# Patient Record
Sex: Male | Born: 1970 | Race: White | Hispanic: No | Marital: Married | Smoking: Former smoker
Health system: Southern US, Community
[De-identification: ages and names within clinical notes are randomized; demographics above are authoritative.]

## PROBLEM LIST (undated history)

## (undated) DIAGNOSIS — I2510 Atherosclerotic heart disease of native coronary artery without angina pectoris: Secondary | ICD-10-CM

## (undated) DIAGNOSIS — E119 Type 2 diabetes mellitus without complications: Secondary | ICD-10-CM

## (undated) DIAGNOSIS — I219 Acute myocardial infarction, unspecified: Secondary | ICD-10-CM

## (undated) HISTORY — PX: SKIN GRAFT SPLIT THICKNESS LEG / FOOT: SUR1303

## (undated) HISTORY — PX: APPENDECTOMY: SHX54

---

## 2013-05-22 ENCOUNTER — Emergency Department (INDEPENDENT_AMBULATORY_CARE_PROVIDER_SITE_OTHER): Payer: Self-pay

## 2013-05-22 ENCOUNTER — Emergency Department (INDEPENDENT_AMBULATORY_CARE_PROVIDER_SITE_OTHER)
Admission: EM | Admit: 2013-05-22 | Discharge: 2013-05-22 | Disposition: A | Discharge status: Home / Self Care | Payer: Self-pay | Source: Home / Self Care | Attending: Emergency Medicine | Admitting: Emergency Medicine

## 2013-05-22 ENCOUNTER — Encounter (HOSPITAL_COMMUNITY): Payer: Self-pay | Admitting: Emergency Medicine

## 2013-05-22 DIAGNOSIS — M79674 Pain in right toe(s): Secondary | ICD-10-CM

## 2013-05-22 DIAGNOSIS — M79609 Pain in unspecified limb: Secondary | ICD-10-CM

## 2013-05-22 MED ORDER — NAPROXEN 500 MG PO TABS: 500.0000 mg | ORAL_TABLET | Freq: Two times a day (BID) | ORAL | Status: DC

## 2013-05-22 NOTE — ED Notes (Signed)
Pt  Reports  Pain  And  Swelling  r    4th  Toe for  What  He   Says  Has  Been  About  1  Month      He  Says  He  Injured it  In past    But  Did  Not  Receive  An  X  Ray           He  States  The  Pain is  Getting  Progressively    Worse     Since  His  New  Job  Which  Requires  Standing
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
## 2013-05-23 NOTE — ED Provider Notes (Signed)
Medical screening examination/treatment/procedure(s) were performed by a resident physician or non-physician practitioner and as the supervising physician I was immediately available for consultation/collaboration.  Clementeen Graham, MD    Rodolph Bong, MD 05/23/13 [phone]

## 2013-12-29 ENCOUNTER — Emergency Department (INDEPENDENT_AMBULATORY_CARE_PROVIDER_SITE_OTHER)
Admission: EM | Admit: 2013-12-29 | Discharge: 2013-12-29 | Disposition: A | Discharge status: Home / Self Care | Payer: Self-pay | Source: Home / Self Care | Attending: Family Medicine | Admitting: Family Medicine

## 2013-12-29 ENCOUNTER — Encounter (HOSPITAL_COMMUNITY): Payer: Self-pay | Admitting: Emergency Medicine

## 2013-12-29 DIAGNOSIS — H109 Unspecified conjunctivitis: Secondary | ICD-10-CM

## 2013-12-29 MED ORDER — TOBRAMYCIN 0.3 % OP SOLN
1.0000 [drp] | OPHTHALMIC | Status: DC
  Administered 2013-12-29: 1 [drp] via OPHTHALMIC

## 2013-12-29 MED ORDER — TOBRAMYCIN 0.3 % OP SOLN
OPHTHALMIC | Status: AC
  Filled 2013-12-29: qty 5

## 2013-12-29 NOTE — ED Provider Notes (Signed)
CSN: [id number]     Arrival date & time 12/29/13  1705 History   First MD Initiated Contact with Patient 12/29/13 1733     Chief Complaint  Patient presents with  . Conjunctivitis   (Consider location/radiation/quality/duration/timing/severity/associated sxs/prior Treatment) HPI Comments: 43 year old male presents for evaluation of right eye redness and irritation for about one week. This started as an itchy, scratching sensation in the eye and it has gotten gradually worse. He has had a very slight amount of drainage in the eye but nothing significant. This is in his right eye only, his left eye is not affected. He is also felt a general sense of fatigue. He denies any other systemic symptoms. He has no previous history of eye problems.   History reviewed. No pertinent past medical history. History reviewed. No pertinent past surgical history. No family history on file. History  Substance Use Topics  . Smoking status: Never Smoker   . Smokeless tobacco: Not on file  . Alcohol Use: No    Review of Systems  Constitutional: Positive for fatigue. Negative for fever and chills.  Eyes: Positive for redness and itching. Negative for photophobia and visual disturbance.  All other systems reviewed and are negative.   Allergies  Review of patient's allergies indicates no known allergies.  Home Medications   Prior to Admission medications   Medication Sig Start Date End Date Taking? Authorizing Provider  naproxen (NAPROSYN) 500 MG tablet Take 1 tablet (500 mg total) by mouth 2 (two) times daily. 05/22/13   Adrian Blackwater Ashani Pumphrey, PA-C   BP 144/85  Pulse 83  Temp(Src) 98.1 F (36.7 C) (Oral)  Resp 12  SpO2 100% Physical Exam  Nursing note and vitals reviewed. Constitutional: He is oriented to person, place, and time. He appears well-developed and well-nourished. No distress.  HENT:  Head: Normocephalic and atraumatic.  Eyes: EOM are normal. Pupils are equal, round, and reactive to light.  Right eye exhibits no discharge and no exudate. Left eye exhibits no discharge and no exudate. Right conjunctiva is injected. Left conjunctiva is not injected.  Pulmonary/Chest: Effort normal. No respiratory distress.  Neurological: He is alert and oriented to person, place, and time. Coordination normal.  Skin: Skin is warm and dry. No rash noted. He is not diaphoretic.  Psychiatric: He has a normal mood and affect. Judgment normal.    ED Course  Procedures (including critical care time) Labs Review Labs Reviewed - No data to display  Imaging Review No results found.   MDM   1. Conjunctivitis    Conjunctivitis, viral versus bacterial. viral will be self-limited, if it is bacterial it doesn't seem to be getting better, will treat with tobramycin drops. Followup when necessary if no improvement in a week, sooner if worsening  Meds ordered this encounter  Medications  . tobramycin (TOBREX) 0.3 % ophthalmic solution 1 drop    Sig:        Graylon Good, PA-C 12/29/13 [phone]

## 2013-12-29 NOTE — ED Notes (Signed)
Patient c/o right eye irritation and redness x 1 week. Patient reports both eyes are itchy and red. Patient reports he has been using Visine eye drops and OTC allergy eye drops with no relief. Patient is alert and oriented and in no acute distress.

## 2013-12-29 NOTE — Discharge Instructions (Signed)
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
## 2013-12-30 NOTE — ED Provider Notes (Signed)
Medical screening examination/treatment/procedure(s) were performed by resident physician or non-physician practitioner and as supervising physician I was immediately available for consultation/collaboration.   Sheba Whaling DOUGLAS MD.   Denise Washburn D Deshunda Thackston, MD 12/30/13 1718 

## 2014-01-07 ENCOUNTER — Encounter (HOSPITAL_COMMUNITY): Payer: Self-pay | Admitting: Emergency Medicine

## 2014-01-07 ENCOUNTER — Emergency Department (HOSPITAL_COMMUNITY)
Admission: EM | Admit: 2014-01-07 | Discharge: 2014-01-07 | Disposition: A | Discharge status: Home / Self Care | Payer: Self-pay | Source: Home / Self Care

## 2014-01-07 DIAGNOSIS — H109 Unspecified conjunctivitis: Secondary | ICD-10-CM

## 2014-01-07 MED ORDER — DEXAMETHASONE 0.1 % OP SUSP: 1.0000 [drp] | Freq: Two times a day (BID) | OPHTHALMIC | Status: DC

## 2014-01-07 MED ORDER — TOBRAMYCIN 0.3 % OP SOLN
OPHTHALMIC | Status: AC
  Filled 2014-01-07: qty 5

## 2014-01-07 MED ORDER — TOBRAMYCIN 0.3 % OP SOLN: 1.0000 [drp] | Freq: Four times a day (QID) | OPHTHALMIC | Status: DC

## 2014-01-07 NOTE — ED Notes (Signed)
C/o minimal relief w Rx eye drops prescribed 6-15.states he has been compliant w the medication as ordered

## 2014-01-07 NOTE — ED Provider Notes (Signed)
CSN: [id number]     Arrival date & time 01/07/14  1835 History   First MD Initiated Contact with Patient 01/07/14 1907     Chief Complaint  Patient presents with  . Conjunctivitis   (Consider location/radiation/quality/duration/timing/severity/associated sxs/prior Treatment) HPI Comments: Returns for persistent redness OD Was here about 1 week ago for R conjunctivitis, better but some redness and swelling persists. No visual problems. No pain.   History reviewed. No pertinent past medical history. History reviewed. No pertinent past surgical history. History reviewed. No pertinent family history. History  Substance Use Topics  . Smoking status: Never Smoker   . Smokeless tobacco: Not on file  . Alcohol Use: No    Review of Systems  Constitutional: Negative.   HENT: Negative.   Eyes: Positive for discharge and redness. Negative for photophobia and visual disturbance.    Allergies  Review of patient's allergies indicates no known allergies.  Home Medications   Prior to Admission medications   Medication Sig Start Date End Date Taking? Authorizing Provider  tobramycin (TOBREX) 0.3 % ophthalmic solution every 4 (four) hours.   Yes Historical Provider, MD  dexamethasone (DECADRON) 0.1 % ophthalmic suspension Place 1 drop into both eyes 2 (two) times daily. 01/07/14   Hayden Rasmussen, NP  tobramycin (TOBREX) 0.3 % ophthalmic solution Place 1 drop into the right eye every 6 (six) hours. X 5 days 01/07/14   Hayden Rasmussen, NP   BP 122/75  Pulse 77  Temp(Src) 97.9 F (36.6 C) (Oral)  Resp 16  SpO2 96% Physical Exam  Nursing note and vitals reviewed. Constitutional: He is oriented to person, place, and time. He appears well-developed and well-nourished. No distress.  Eyes: EOM are normal. Pupils are equal, round, and reactive to light.  Mild erythema to the lower eye lid, lesser to the upper. Mild medial scleral injection.  Minor erythema to lower lid of OS lower lid.  Neurological: He  is alert and oriented to person, place, and time. He exhibits normal muscle tone.  Skin: Skin is warm and dry.  Psychiatric: He has a normal mood and affect.    ED Course  Procedures (including critical care time) Labs Review Labs Reviewed - No data to display  Imaging Review No results found.   MDM   1. Conjunctivitis, left eye     Does not appear to be bacterial Add dexamethasone for antiinflammatory effect      Hayden Rasmussen, NP 01/07/14 2001

## 2014-01-07 NOTE — Discharge Instructions (Signed)

## 2014-01-08 NOTE — ED Provider Notes (Signed)
Medical screening examination/treatment/procedure(s) were performed by a resident physician or non-physician practitioner and as the supervising physician I was immediately available for consultation/collaboration.  Evan Corey, MD    Evan S Corey, MD 01/08/14 0730 

## 2014-02-17 ENCOUNTER — Encounter (HOSPITAL_COMMUNITY): Payer: Self-pay | Admitting: Emergency Medicine

## 2014-02-17 ENCOUNTER — Observation Stay (HOSPITAL_COMMUNITY): Payer: Self-pay | Admitting: Anesthesiology

## 2014-02-17 ENCOUNTER — Emergency Department (INDEPENDENT_AMBULATORY_CARE_PROVIDER_SITE_OTHER)
Admission: EM | Admit: 2014-02-17 | Discharge: 2014-02-17 | Disposition: A | Discharge status: Hospice | Payer: Self-pay | Source: Home / Self Care | Attending: Family Medicine | Admitting: Family Medicine

## 2014-02-17 ENCOUNTER — Emergency Department (HOSPITAL_COMMUNITY): Payer: Self-pay

## 2014-02-17 ENCOUNTER — Observation Stay (HOSPITAL_COMMUNITY)
Admission: EM | Admit: 2014-02-17 | Discharge: 2014-02-18 | Disposition: A | Discharge status: Home / Self Care | Payer: Self-pay | Attending: General Surgery | Admitting: General Surgery

## 2014-02-17 ENCOUNTER — Encounter (HOSPITAL_COMMUNITY): Payer: Self-pay | Admitting: Anesthesiology

## 2014-02-17 ENCOUNTER — Encounter (HOSPITAL_COMMUNITY)
Admission: EM | Disposition: A | Discharge status: Home / Self Care | Payer: Self-pay | Source: Home / Self Care | Attending: Emergency Medicine

## 2014-02-17 ENCOUNTER — Emergency Department (INDEPENDENT_AMBULATORY_CARE_PROVIDER_SITE_OTHER): Payer: Self-pay

## 2014-02-17 DIAGNOSIS — R109 Unspecified abdominal pain: Secondary | ICD-10-CM

## 2014-02-17 DIAGNOSIS — K3580 Unspecified acute appendicitis: Principal | ICD-10-CM | POA: Insufficient documentation

## 2014-02-17 DIAGNOSIS — Z87891 Personal history of nicotine dependence: Secondary | ICD-10-CM | POA: Insufficient documentation

## 2014-02-17 DIAGNOSIS — R110 Nausea: Secondary | ICD-10-CM

## 2014-02-17 DIAGNOSIS — K56609 Unspecified intestinal obstruction, unspecified as to partial versus complete obstruction: Secondary | ICD-10-CM

## 2014-02-17 DIAGNOSIS — K37 Unspecified appendicitis: Secondary | ICD-10-CM | POA: Diagnosis present

## 2014-02-17 HISTORY — PX: LAPAROSCOPIC APPENDECTOMY: SHX408

## 2014-02-17 LAB — POCT URINALYSIS DIP (DEVICE)
Bilirubin Urine: NEGATIVE
GLUCOSE, UA: NEGATIVE mg/dL
LEUKOCYTES UA: NEGATIVE
Nitrite: NEGATIVE
Protein, ur: 30 mg/dL — AB
SPECIFIC GRAVITY, URINE: 1.02 (ref 1.005–1.030)
UROBILINOGEN UA: 2 mg/dL — AB (ref 0.0–1.0)
pH: 6 (ref 5.0–8.0)

## 2014-02-17 LAB — COMPREHENSIVE METABOLIC PANEL
ALBUMIN: 4 g/dL (ref 3.5–5.2)
ALT: 29 U/L (ref 0–53)
ANION GAP: 11 (ref 5–15)
AST: 19 U/L (ref 0–37)
Alkaline Phosphatase: 55 U/L (ref 39–117)
BUN: 8 mg/dL (ref 6–23)
CALCIUM: 9.5 mg/dL (ref 8.4–10.5)
CO2: 28 mEq/L (ref 19–32)
CREATININE: 0.88 mg/dL (ref 0.50–1.35)
Chloride: 97 mEq/L (ref 96–112)
GFR calc Af Amer: >90 mL/min (ref >90)
GFR calc non Af Amer: >90 mL/min (ref >90)
Glucose, Bld: 166 mg/dL — ABNORMAL HIGH (ref 70–99)
Potassium: 4.2 mEq/L (ref 3.7–5.3)
Sodium: 136 mEq/L — ABNORMAL LOW (ref 137–147)
Total Bilirubin: 1 mg/dL (ref 0.3–1.2)
Total Protein: 7.8 g/dL (ref 6.0–8.3)

## 2014-02-17 LAB — CBC
HCT: 43.7 % (ref 39.0–52.0)
Hemoglobin: 14.9 g/dL (ref 13.0–17.0)
MCH: 30.6 pg (ref 26.0–34.0)
MCHC: 34.1 g/dL (ref 30.0–36.0)
MCV: 89.7 fL (ref 78.0–100.0)
PLATELETS: 190 10*3/uL (ref 150–400)
RBC: 4.87 MIL/uL (ref 4.22–5.81)
RDW: 12.1 % (ref 11.5–15.5)
WBC: 10.5 10*3/uL (ref 4.0–10.5)

## 2014-02-17 LAB — LIPASE, BLOOD: LIPASE: 14 U/L (ref 11–59)

## 2014-02-17 SURGERY — APPENDECTOMY, LAPAROSCOPIC
Anesthesia: General | Site: Abdomen

## 2014-02-17 MED ORDER — OXYCODONE-ACETAMINOPHEN 5-325 MG PO TABS: 1.0000 | ORAL_TABLET | ORAL | Status: DC | PRN

## 2014-02-17 MED ORDER — LIDOCAINE HCL (CARDIAC) 20 MG/ML IV SOLN
INTRAVENOUS | Status: DC | PRN
  Administered 2014-02-17: 75 mg via INTRAVENOUS

## 2014-02-17 MED ORDER — GLYCOPYRROLATE 0.2 MG/ML IJ SOLN
INTRAMUSCULAR | Status: DC | PRN
  Administered 2014-02-17: .7 mg via INTRAVENOUS

## 2014-02-17 MED ORDER — MORPHINE SULFATE 2 MG/ML IJ SOLN
1.0000 mg | INTRAMUSCULAR | Status: DC | PRN
  Administered 2014-02-17: 4 mg via INTRAVENOUS
  Administered 2014-02-17: 2 mg via INTRAVENOUS
  Administered 2014-02-18 (×4): 4 mg via INTRAVENOUS
  Filled 2014-02-17 (×2): qty 2
  Filled 2014-02-17: qty 1
  Filled 2014-02-17 (×4): qty 2

## 2014-02-17 MED ORDER — FENTANYL CITRATE 0.05 MG/ML IJ SOLN
INTRAMUSCULAR | Status: AC
  Filled 2014-02-17: qty 5

## 2014-02-17 MED ORDER — HYDROMORPHONE HCL PF 1 MG/ML IJ SOLN: 0.2500 mg | INTRAMUSCULAR | Status: DC | PRN

## 2014-02-17 MED ORDER — MIDAZOLAM HCL 2 MG/2ML IJ SOLN
INTRAMUSCULAR | Status: AC
  Filled 2014-02-17: qty 2

## 2014-02-17 MED ORDER — VECURONIUM BROMIDE 10 MG IV SOLR
INTRAVENOUS | Status: DC | PRN
  Administered 2014-02-17: 5 mg via INTRAVENOUS

## 2014-02-17 MED ORDER — MIDAZOLAM HCL 5 MG/5ML IJ SOLN
INTRAMUSCULAR | Status: DC | PRN
  Administered 2014-02-17: 2 mg via INTRAVENOUS

## 2014-02-17 MED ORDER — ONDANSETRON HCL 4 MG/2ML IJ SOLN
INTRAMUSCULAR | Status: DC | PRN
  Administered 2014-02-17: 4 mg via INTRAVENOUS

## 2014-02-17 MED ORDER — NEOSTIGMINE METHYLSULFATE 10 MG/10ML IV SOLN
INTRAVENOUS | Status: DC | PRN
  Administered 2014-02-17: 4 mg via INTRAVENOUS

## 2014-02-17 MED ORDER — BUPIVACAINE HCL 0.25 % IJ SOLN
INTRAMUSCULAR | Status: DC | PRN
  Administered 2014-02-17: 30 mL

## 2014-02-17 MED ORDER — OXYCODONE-ACETAMINOPHEN 5-325 MG PO TABS
1.0000 | ORAL_TABLET | ORAL | Status: DC | PRN
  Administered 2014-02-18: 2 via ORAL
  Filled 2014-02-17: qty 2

## 2014-02-17 MED ORDER — DIPHENHYDRAMINE HCL 12.5 MG/5ML PO ELIX: 12.5000 mg | ORAL_SOLUTION | Freq: Four times a day (QID) | ORAL | Status: DC | PRN

## 2014-02-17 MED ORDER — IOHEXOL 300 MG/ML  SOLN
25.0000 mL | Freq: Once | INTRAMUSCULAR | Status: AC | PRN
  Administered 2014-02-17: 25 mL via ORAL

## 2014-02-17 MED ORDER — SUCCINYLCHOLINE CHLORIDE 20 MG/ML IJ SOLN
INTRAMUSCULAR | Status: DC | PRN
  Administered 2014-02-17: 100 mg via INTRAVENOUS

## 2014-02-17 MED ORDER — DEXTROSE-NACL 5-0.9 % IV SOLN
INTRAVENOUS | Status: DC
  Administered 2014-02-17: 22:00:00 via INTRAVENOUS

## 2014-02-17 MED ORDER — ONDANSETRON HCL 4 MG/2ML IJ SOLN
4.0000 mg | Freq: Four times a day (QID) | INTRAMUSCULAR | Status: DC | PRN
Start: 2014-02-17 — End: 2014-02-18

## 2014-02-17 MED ORDER — PROPOFOL 10 MG/ML IV BOLUS
INTRAVENOUS | Status: DC | PRN
  Administered 2014-02-17: 200 mg via INTRAVENOUS

## 2014-02-17 MED ORDER — ONDANSETRON HCL 4 MG/2ML IJ SOLN: 4.0000 mg | Freq: Four times a day (QID) | INTRAMUSCULAR | Status: DC | PRN

## 2014-02-17 MED ORDER — SCOPOLAMINE 1 MG/3DAYS TD PT72
MEDICATED_PATCH | TRANSDERMAL | Status: AC
  Filled 2014-02-17: qty 1

## 2014-02-17 MED ORDER — OXYCODONE-ACETAMINOPHEN 5-325 MG PO TABS
1.0000 | ORAL_TABLET | Freq: Once | ORAL | Status: AC
  Administered 2014-02-17: 1 via ORAL
  Filled 2014-02-17: qty 1

## 2014-02-17 MED ORDER — DIPHENHYDRAMINE HCL 50 MG/ML IJ SOLN: 12.5000 mg | Freq: Four times a day (QID) | INTRAMUSCULAR | Status: DC | PRN

## 2014-02-17 MED ORDER — GI COCKTAIL ~~LOC~~
30.0000 mL | Freq: Once | ORAL | Status: AC
  Administered 2014-02-17: 30 mL via ORAL

## 2014-02-17 MED ORDER — BUPIVACAINE HCL (PF) 0.25 % IJ SOLN
INTRAMUSCULAR | Status: AC
  Filled 2014-02-17: qty 30

## 2014-02-17 MED ORDER — METRONIDAZOLE IN NACL 5-0.79 MG/ML-% IV SOLN
500.0000 mg | Freq: Three times a day (TID) | INTRAVENOUS | Status: DC
  Administered 2014-02-17 – 2014-02-18 (×3): 500 mg via INTRAVENOUS
  Filled 2014-02-17 (×6): qty 100

## 2014-02-17 MED ORDER — PIPERACILLIN-TAZOBACTAM 3.375 G IVPB
3.3750 g | Freq: Three times a day (TID) | INTRAVENOUS | Status: AC
  Administered 2014-02-17: 3.375 g via INTRAVENOUS
  Filled 2014-02-17: qty 50

## 2014-02-17 MED ORDER — GI COCKTAIL ~~LOC~~
ORAL | Status: AC
  Filled 2014-02-17: qty 30

## 2014-02-17 MED ORDER — SODIUM CHLORIDE 0.9 % IR SOLN
Status: DC | PRN
  Administered 2014-02-17: 1000 mL

## 2014-02-17 MED ORDER — BIOTENE DRY MOUTH MT LIQD: 15.0000 mL | OROMUCOSAL | Status: DC | PRN

## 2014-02-17 MED ORDER — POTASSIUM CHLORIDE IN NACL 20-0.9 MEQ/L-% IV SOLN
INTRAVENOUS | Status: DC
  Administered 2014-02-17: 15:00:00 via INTRAVENOUS
  Filled 2014-02-17 (×5): qty 1000

## 2014-02-17 MED ORDER — ONDANSETRON HCL 4 MG/2ML IJ SOLN: 4.0000 mg | Freq: Once | INTRAMUSCULAR | Status: DC | PRN

## 2014-02-17 MED ORDER — 0.9 % SODIUM CHLORIDE (POUR BTL) OPTIME
TOPICAL | Status: DC | PRN
  Administered 2014-02-17: 1000 mL

## 2014-02-17 MED ORDER — DEXAMETHASONE SODIUM PHOSPHATE 10 MG/ML IJ SOLN
INTRAMUSCULAR | Status: DC | PRN
  Administered 2014-02-17: 10 mg via INTRAVENOUS

## 2014-02-17 MED ORDER — ONDANSETRON 4 MG PO TBDP
8.0000 mg | ORAL_TABLET | Freq: Once | ORAL | Status: AC
  Administered 2014-02-17: 8 mg via ORAL
  Filled 2014-02-17: qty 2

## 2014-02-17 MED ORDER — IOHEXOL 300 MG/ML  SOLN
80.0000 mL | Freq: Once | INTRAMUSCULAR | Status: AC | PRN
  Administered 2014-02-17: 80 mL via INTRAVENOUS

## 2014-02-17 MED ORDER — ONDANSETRON HCL 4 MG PO TABS: 4.0000 mg | ORAL_TABLET | Freq: Four times a day (QID) | ORAL | Status: DC | PRN

## 2014-02-17 MED ORDER — FENTANYL CITRATE 0.05 MG/ML IJ SOLN
INTRAMUSCULAR | Status: DC | PRN
  Administered 2014-02-17: 100 ug via INTRAVENOUS
  Administered 2014-02-17: 150 ug via INTRAVENOUS

## 2014-02-17 MED ORDER — HYDROMORPHONE HCL PF 1 MG/ML IJ SOLN: 1.0000 mg | INTRAMUSCULAR | Status: DC | PRN

## 2014-02-17 MED ORDER — LACTATED RINGERS IV SOLN
INTRAVENOUS | Status: DC
  Administered 2014-02-17: 18:00:00 via INTRAVENOUS

## 2014-02-17 MED ORDER — PROPOFOL 10 MG/ML IV BOLUS
INTRAVENOUS | Status: AC
  Filled 2014-02-17: qty 20

## 2014-02-17 MED ORDER — OXYCODONE HCL 5 MG PO TABS: 5.0000 mg | ORAL_TABLET | Freq: Once | ORAL | Status: DC | PRN

## 2014-02-17 MED ORDER — MORPHINE SULFATE 4 MG/ML IJ SOLN
6.0000 mg | Freq: Once | INTRAMUSCULAR | Status: AC
  Administered 2014-02-17: 6 mg via INTRAVENOUS
  Filled 2014-02-17: qty 2

## 2014-02-17 MED ORDER — LACTATED RINGERS IV SOLN
INTRAVENOUS | Status: DC | PRN
  Administered 2014-02-17 (×2): via INTRAVENOUS

## 2014-02-17 MED ORDER — OXYCODONE HCL 5 MG/5ML PO SOLN: 5.0000 mg | Freq: Once | ORAL | Status: DC | PRN

## 2014-02-17 MED ORDER — SCOPOLAMINE 1 MG/3DAYS TD PT72
1.0000 | MEDICATED_PATCH | TRANSDERMAL | Status: DC
  Administered 2014-02-17: 1.5 mg via TRANSDERMAL

## 2014-02-17 MED ORDER — SODIUM CHLORIDE 0.9 % IV BOLUS (SEPSIS)
1000.0000 mL | Freq: Once | INTRAVENOUS | Status: AC
  Administered 2014-02-17: 1000 mL via INTRAVENOUS

## 2014-02-17 MED ORDER — CEFTRIAXONE SODIUM 2 G IJ SOLR
2.0000 g | INTRAMUSCULAR | Status: DC
  Administered 2014-02-17: 2 g via INTRAVENOUS
  Filled 2014-02-17 (×2): qty 2

## 2014-02-17 SURGICAL SUPPLY — 49 items
APPLIER CLIP 5 13 M/L LIGAMAX5 (MISCELLANEOUS)
BENZOIN TINCTURE PRP APPL 2/3 (GAUZE/BANDAGES/DRESSINGS) ×3 IMPLANT
BLADE SURG ROTATE 9660 (MISCELLANEOUS) ×3 IMPLANT
CANISTER SUCTION 2500CC (MISCELLANEOUS) ×3 IMPLANT
CHLORAPREP W/TINT 26ML (MISCELLANEOUS) ×3 IMPLANT
CLIP APPLIE 5 13 M/L LIGAMAX5 (MISCELLANEOUS) IMPLANT
CLOSURE STERI-STRIP 1/2X4 (GAUZE/BANDAGES/DRESSINGS) ×1
CLSR STERI-STRIP ANTIMIC 1/2X4 (GAUZE/BANDAGES/DRESSINGS) ×2 IMPLANT
COVER SURGICAL LIGHT HANDLE (MISCELLANEOUS) ×3 IMPLANT
COVER TRANSDUCER ULTRASND (DRAPES) ×3 IMPLANT
DEVICE TROCAR PUNCTURE CLOSURE (ENDOMECHANICALS) ×3 IMPLANT
DRAPE UTILITY 15X26 W/TAPE STR (DRAPE) ×3 IMPLANT
ELECT REM PT RETURN 9FT ADLT (ELECTROSURGICAL) ×3
ELECTRODE REM PT RTRN 9FT ADLT (ELECTROSURGICAL) ×1 IMPLANT
ENDOLOOP SUT PDS II  0 18 (SUTURE) ×8
ENDOLOOP SUT PDS II 0 18 (SUTURE) ×4 IMPLANT
GAUZE SPONGE 2X2 8PLY STRL LF (GAUZE/BANDAGES/DRESSINGS) ×1 IMPLANT
GLOVE BIO SURGEON STRL SZ 6.5 (GLOVE) ×2 IMPLANT
GLOVE BIO SURGEON STRL SZ7.5 (GLOVE) ×6 IMPLANT
GLOVE BIO SURGEON STRL SZ8 (GLOVE) ×3 IMPLANT
GLOVE BIO SURGEONS STRL SZ 6.5 (GLOVE) ×1
GLOVE BIOGEL PI IND STRL 6.5 (GLOVE) ×1 IMPLANT
GLOVE BIOGEL PI IND STRL 7.5 (GLOVE) ×1 IMPLANT
GLOVE BIOGEL PI INDICATOR 6.5 (GLOVE) ×2
GLOVE BIOGEL PI INDICATOR 7.5 (GLOVE) ×2
GOWN STRL REUS W/ TWL LRG LVL3 (GOWN DISPOSABLE) ×2 IMPLANT
GOWN STRL REUS W/ TWL XL LVL3 (GOWN DISPOSABLE) ×1 IMPLANT
GOWN STRL REUS W/TWL LRG LVL3 (GOWN DISPOSABLE) ×4
GOWN STRL REUS W/TWL XL LVL3 (GOWN DISPOSABLE) ×2
KIT BASIN OR (CUSTOM PROCEDURE TRAY) ×3 IMPLANT
KIT ROOM TURNOVER OR (KITS) ×3 IMPLANT
NEEDLE INSUFFLATION 14GA 120MM (NEEDLE) ×3 IMPLANT
NS IRRIG 1000ML POUR BTL (IV SOLUTION) ×3 IMPLANT
PAD ARMBOARD 7.5X6 YLW CONV (MISCELLANEOUS) ×6 IMPLANT
POUCH SPECIMEN RETRIEVAL 10MM (ENDOMECHANICALS) ×3 IMPLANT
SCISSORS LAP 5X35 DISP (ENDOMECHANICALS) ×3 IMPLANT
SET IRRIG TUBING LAPAROSCOPIC (IRRIGATION / IRRIGATOR) ×3 IMPLANT
SLEEVE ENDOPATH XCEL 5M (ENDOMECHANICALS) ×3 IMPLANT
SPECIMEN JAR SMALL (MISCELLANEOUS) ×3 IMPLANT
SPONGE GAUZE 2X2 STER 10/PKG (GAUZE/BANDAGES/DRESSINGS) ×2
SUT MNCRL AB 3-0 PS2 18 (SUTURE) ×3 IMPLANT
SUT VIC AB 1 BRD 54 (SUTURE) IMPLANT
SUT VICRYL 0 UR6 27IN ABS (SUTURE) ×3 IMPLANT
TOWEL OR 17X24 6PK STRL BLUE (TOWEL DISPOSABLE) ×3 IMPLANT
TOWEL OR 17X26 10 PK STRL BLUE (TOWEL DISPOSABLE) ×3 IMPLANT
TRAY FOLEY CATH 16FR SILVER (SET/KITS/TRAYS/PACK) ×3 IMPLANT
TRAY LAPAROSCOPIC (CUSTOM PROCEDURE TRAY) ×3 IMPLANT
TROCAR XCEL NON-BLD 11X100MML (ENDOMECHANICALS) ×3 IMPLANT
TROCAR XCEL NON-BLD 5MMX100MML (ENDOMECHANICALS) ×3 IMPLANT

## 2014-02-17 NOTE — ED Notes (Signed)
To ct

## 2014-02-17 NOTE — Anesthesia Postprocedure Evaluation (Signed)
  Anesthesia Post-op Note  Patient: Miguel Thornton  Procedure(s) Performed: Procedure(s): APPENDECTOMY LAPAROSCOPIC (N/A)  Patient Location: PACU  Anesthesia Type: General   Level of Consciousness: awake, alert  and oriented  Airway and Oxygen Therapy: Patient Spontanous Breathing  Post-op Pain: mild  Post-op Assessment: Post-op Vital signs reviewed  Post-op Vital Signs: Reviewed  Last Vitals:  Filed Vitals:   02/17/14 2045  BP: 121/72  Pulse: 84  Temp: 37.2 C  Resp: 13    Complications: No apparent anesthesia complications

## 2014-02-17 NOTE — Anesthesia Procedure Notes (Signed)
Procedure Name: Intubation Date/Time: 02/17/2014 7:11 PM Performed by: Gwenyth Allegra Pre-anesthesia Checklist: Emergency Drugs available, Timeout performed, Suction available, Patient identified and Patient being monitored Patient Re-evaluated:Patient Re-evaluated prior to inductionOxygen Delivery Method: Circle system utilized Preoxygenation: Pre-oxygenation with 100% oxygen Intubation Type: IV induction, Rapid sequence and Cricoid Pressure applied Laryngoscope Size: Mac and 4 Tube type: Oral Tube size: 7.5 mm Number of attempts: 1 Airway Equipment and Method: Stylet Placement Confirmation: ETT inserted through vocal cords under direct vision,  breath sounds checked- equal and bilateral and positive ETCO2 Secured at: 22 cm Tube secured with: Tape Dental Injury: Teeth and Oropharynx as per pre-operative assessment

## 2014-02-17 NOTE — ED Notes (Signed)
Pt sent from urgent care for further eval of abdominal pain onset Sunday. Pt last ate Sunday. Pt reports nausea.

## 2014-02-17 NOTE — Transfer of Care (Signed)
Immediate Anesthesia Transfer of Care Note  Patient: Miguel Thornton  Procedure(s) Performed: Procedure(s): APPENDECTOMY LAPAROSCOPIC (N/A)  Patient Location: PACU  Anesthesia Type:General  Level of Consciousness: awake and alert   Airway & Oxygen Therapy: Patient Spontanous Breathing and Patient connected to nasal cannula oxygen  Post-op Assessment: Report given to PACU RN and Post -op Vital signs reviewed and stable  Post vital signs: Reviewed and stable  Complications: No apparent anesthesia complications

## 2014-02-17 NOTE — ED Provider Notes (Signed)
Medical screening examination/treatment/procedure(s) were conducted as a shared visit with non-physician practitioner(s) and myself.  I personally evaluated the patient during the encounter.   EKG Interpretation None      Here with abdominal pain. RLQ pain on exam with +Rovsing's sign. CT shows appendicitis. Admitted by Surgery.  Elwin Mocha, MD 02/17/14 2010

## 2014-02-17 NOTE — ED Notes (Signed)
CT aware pt done drinking contrast

## 2014-02-17 NOTE — ED Notes (Signed)
C/o abd pain which developed Sunday night to Monday States loss of appetite. States little relief. With zantac States pain has him balling up in a ball States first bowel movement since Sunday was this morning.

## 2014-02-17 NOTE — Consult Note (Signed)
[facility]  Admission Note  Miguel Thornton  02/06/1971  [id number].   Requesting MD: Dr. Walden  Chief Complaint/Reason for Consult: Acute appendicitis   HPI:  42 y/o healthy male presents to [facility] with abdominal pain, that started on Sunday. The patient states not feeling well on sunday, anorexia and constipation for several days. C/o chills, nausea but no vomiting. Only able to keep done some liquids. Patient c/o pain on right which radiates to the left. Tried zantac without relief. The patient denies fever, CP/SOB, back pain, headache, blurred vision, dysuria, bloody stool, or diarrhea. He states he took a Zantac with some relief of his symptoms, but not complete relief. No other alleviating or precipitating factors. Works in a warehouse and stacks boxes/pallets. Never had surgery on his abdomen before or hernia.   ROS:  All systems reviewed and otherwise negative except for as above  No family history on file.  History reviewed. No pertinent past medical history.  History reviewed. No pertinent past surgical history.  Social History: reports that he has never smoked. He does not have any smokeless tobacco history on file. He reports that he does not drink alcohol or use illicit drugs.  Allergies: No Known Allergies   (Not in a hospital admission)  Blood pressure 142/83, pulse 71, temperature 98.3 F (36.8 C), temperature source Oral, resp. rate 26, height 5' 7" (1.702 m), weight 190 lb (86.183 kg), SpO2 100.00%.   Physical Exam:  General: pleasant, WD/WN white male who is laying in bed in NAD  HEENT: head is normocephalic, atraumatic. Sclera are noninjected. PERRL. Ears and nose without any masses or lesions. Mouth is pink and moist  Heart: regular, rate, and rhythm. No obvious murmurs, gallops, or rubs noted. Palpable pedal pulses bilaterally  Lungs: CTAB, no wheezes, rhonchi, or rales noted. Respiratory effort nonlabored  Abd: soft, ND, tender to palpation in RLQ at  McBurney's point, +BS, no masses, hernias, or organomegaly, no surgical scars, no rebound/guarding  MS: all 4 extremities are symmetrical with no cyanosis, clubbing, or edema.  Skin: warm and dry with no masses, lesions, or rashes  Psych: A&Ox3 with an appropriate affect.    Results for orders placed during the hospital encounter of 02/17/14 (from the past 48 hour(s))   CBC Status: None    Collection Time    02/17/14 8:34 AM   Result  Value  Ref Range    WBC  10.5  4.0 - 10.5 K/uL    RBC  4.87  4.22 - 5.81 MIL/uL    Hemoglobin  14.9  13.0 - 17.0 g/dL    HCT  43.7  39.0 - 52.0 %    MCV  89.7  78.0 - 100.0 fL    MCH  30.6  26.0 - 34.0 pg    MCHC  34.1  30.0 - 36.0 g/dL    RDW  12.1  11.5 - 15.5 %    Platelets  190  150 - 400 K/uL   COMPREHENSIVE METABOLIC PANEL Status: Abnormal    Collection Time    02/17/14 8:34 AM   Result  Value  Ref Range    Sodium  136 (*)  137 - 147 mEq/L    Potassium  4.2  3.7 - 5.3 mEq/L    Chloride  97  96 - 112 mEq/L    CO2  28  19 - 32 mEq/L    Glucose, Bld  166 (*)  70 - 99 mg/dL    BUN    8  6 - 23 mg/dL    Creatinine, Ser  0.88  0.50 - 1.35 mg/dL    Calcium  9.5  8.4 - 10.5 mg/dL    Total Protein  7.8  6.0 - 8.3 g/dL    Albumin  4.0  3.5 - 5.2 g/dL    AST  19  0 - 37 U/L    ALT  29  0 - 53 U/L    Alkaline Phosphatase  55  39 - 117 U/L    Total Bilirubin  1.0  0.3 - 1.2 mg/dL    GFR calc non Af Amer  >90  >90 mL/min    GFR calc Af Amer  >90  >90 mL/min    Comment:  (NOTE)     The eGFR has been calculated using the CKD EPI equation.     This calculation has not been validated in all clinical situations.     eGFR's persistently <90 mL/min signify possible Chronic Kidney     Disease.    Anion gap  11  5 - 15   LIPASE, BLOOD Status: None    Collection Time    02/17/14 8:34 AM   Result  Value  Ref Range    Lipase  14  11 - 59 U/L   POCT URINALYSIS DIP (DEVICE) Status: Abnormal    Collection Time    02/17/14 8:51 AM   Result  Value  Ref Range     Glucose, UA  NEGATIVE  NEGATIVE mg/dL    Bilirubin Urine  NEGATIVE  NEGATIVE    Ketones, ur  TRACE (*)  NEGATIVE mg/dL    Specific Gravity, Urine  1.020  1.005 - 1.030    Hgb urine dipstick  TRACE (*)  NEGATIVE    pH  6.0  5.0 - 8.0    Protein, ur  30 (*)  NEGATIVE mg/dL    Urobilinogen, UA  2.0 (*)  0.0 - 1.0 mg/dL    Nitrite  NEGATIVE  NEGATIVE    Leukocytes, UA  NEGATIVE  NEGATIVE    Comment:  Biochemical Testing Only. Please order routine urinalysis from main lab if confirmatory testing is needed.    Dg Abd 1 View  02/17/2014 CLINICAL DATA: Abdominal pain. EXAM: ABDOMEN - 1 VIEW COMPARISON: None. FINDINGS: A focal loop of dilated bowel is present in the left abdomen. This measures up to 3.5 cm. Gas is present throughout the colon. Small bowel is otherwise relatively gasless. IMPRESSION: Focal dilated loop of small bowel in the left lower quadrant may represent partial obstruction or ileus. Gas is present in the more distal bowel. Electronically Signed By: Chris Mattern M.D. On: 02/17/2014 08:52   Ct Abdomen Pelvis W Contrast  02/17/2014 CLINICAL DATA: Abdominal pain, loss of appetite, constipation EXAM: CT ABDOMEN AND PELVIS WITH CONTRAST TECHNIQUE: Multidetector CT imaging of the abdomen and pelvis was performed using the standard protocol following bolus administration of intravenous contrast. CONTRAST: 80mL OMNIPAQUE IOHEXOL 300 MG/ML SOLN COMPARISON: None. FINDINGS: Ingested enteric contrast extends to the level of the distal small bowel. The appendix is enlarged measuring approximately 11 mm in diameter (coronal image 57, series 5) with associated adjacent periappendiceal mesenteric stranding. No evidence of perforation or definable/ drainable fluid collection. Shotty reactive mesenteric lymph nodes within the right lower abdominal quadrant are not enlarged by size criteria with index precaval lymph node measuring approximately 0.7 cm in greatest short axis diameter. Moderate colonic stool  burden without evidence of enteric obstruction. Normal hepatic contour.   There is mild diffuse decreased attenuation of the hepatic parenchyma on this postcontrast examination suggestive of hepatic steatosis. No discrete hepatic lesions. No radiopaque gallstones. No intra or extrahepatic biliary duct dilatation. No ascites. There is symmetric enhancement and excretion of the bilateral kidneys. No definite renal stones on this postcontrast examination. No discrete renal lesions. No urinary obstruction or perinephric stranding. Normal appearance of the bilateral adrenal glands, pancreas and spleen. Scatter minimal mixed calcified and noncalcified atherosclerotic plaque within a normal caliber abdominal aorta. The major branch vessels of the abdominal aorta appear widely patent on this non CTA examination. No retroperitoneal, mesenteric, pelvic or inguinal lymphadenopathy. Normal appearance of the pelvic organs. Normal appearance of the urinary bladder given degree distention. Limited visualization of the lower thorax demonstrates minimal grossly symmetric deep tendon subpleural ground-glass atelectasis. No discrete focal airspace opacities. No pleural effusion. Normal heart size. No pericardial effusion. No acute or aggressive osseous abnormalities. 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 Regional soft tissues appear normal. IMPRESSION: 1. Findings compatible with acute, uncomplicated appendicitis. No evidence of perforation or drainable/definable fluid collection. 2. Moderate colonic stool burden without evidence of enteric obstruction. 3. Hepatic steatosis suspected on this postcontrast examination. Correlation with LFTs is recommended. Electronically Signed By: John Watts M.D. On: 02/17/2014 13:42    Assessment/Plan  Acute appendicitis   Plan:  1. Admit to [facility] for urgent lap appy  2. NPO, bowel rest, IVF, pain control,  antiemetics, antibiotics (rocephin and flagyl)  3. SCD's and lovenox for DVT proph  4. Ambulate and IS   DORT, Trinaty Bundrick, PA-C  [facility]  02/17/2014, 2:09 PM  Pager: [phone]  

## 2014-02-17 NOTE — Op Note (Addendum)
02/17/2014  PATIENT:  Miguel Thornton  43 y.o. male  PRE-OPERATIVE DIAGNOSIS:  Appendicitis  POST-OPERATIVE DIAGNOSIS:  Purulent appendicitis  PROCEDURE:  Procedure(s): APPENDECTOMY LAPAROSCOPIC (N/A)  SURGEON:  Surgeon(s) and Role:    * Axel Filler, MD - Primary  ASSISTANTS: none   ANESTHESIA:   local and general  EBL:   minimal  BLOOD ADMINISTERED:none  DRAINS: none   LOCAL MEDICATIONS USED:  BUPIVICAINE   SPECIMEN:  Source of Specimen:  appendix  DISPOSITION OF SPECIMEN:  PATHOLOGY  COUNTS:  YES  TOURNIQUET:  * No tourniquets in log *  DICTATION: .Dragon Dictation  Details of the procedure:The patient was taken back to the operating room. The patient was placed in supine position with bilateral SCDs in place. After appropriate anitbiotics were confirmed, a time-out was confirmed and all facts were verified.  A pneumoperitoneum of 14 mmHg was obtained via a Veress needle technique in the left lower quadrant quadrant.  A 5 mm trocar and 5 mm camera then placed intra-abdominally there is no injury to any intra-abdominal organs a 10 mm infraumbilical port was placed and direct visualization as was a 5 mm port in the suprapubic area. The appendix was identified and seen to be inflammed and purulent.  There was no abscess.  The was appendix identified and cleaned down to the appendiceal base. The appendiceal artery was taken with electrocautery maintaining hemostasis, the mesoappendix was then incised.  The the appendiceal base was clean.  At this time an Endoloop was placed proximallyx3 and one distally and the appendix was transected between these 2. The an Endo Catch bag was then placed into the abdomen and the specimen placed in Endo Catch bag. TWe evacuate the fluid from the pelvis until the effluent was clear. The omentum was brought over the appendiceal stump. The appendix a latex retrieval  bag was then retrieved via the supraumbilical port. #1 Vicryl was used to  reapproximate the fascia at the umbilical port sitein a figure of eight pattern. The skin was reapproximated all port sites 3-0 Monocryl subcuticular fashion. The skin was dressed with Steri-Strips gauze and tape. The patient was awakened from general anesthesia was taken to recovery room in stable condition.     PLAN OF CARE: Admit for overnight observation  PATIENT DISPOSITION:  PACU - hemodynamically stable.   Delay start of Pharmacological VTE agent (>24hrs) due to surgical blood loss or risk of bleeding: not applicable

## 2014-02-17 NOTE — Anesthesia Preprocedure Evaluation (Addendum)
Anesthesia Evaluation  Patient identified by MRN, date of birth, ID band Patient awake    Reviewed: Allergy & Precautions, H&P , NPO status , Patient's Chart, lab work & pertinent test results  Airway Mallampati: I TM Distance: >3 FB Neck ROM: Full    Dental  (+) Teeth Intact, Dental Advisory Given   Pulmonary former smoker,  breath sounds clear to auscultation        Cardiovascular Rhythm:Regular Rate:Normal     Neuro/Psych    GI/Hepatic   Endo/Other    Renal/GU      Musculoskeletal   Abdominal   Peds  Hematology   Anesthesia Other Findings   Reproductive/Obstetrics                           Anesthesia Physical Anesthesia Plan  ASA: II  Anesthesia Plan: General   Post-op Pain Management:    Induction: Intravenous, Rapid sequence and Cricoid pressure planned  Airway Management Planned: Oral ETT  Additional Equipment:   Intra-op Plan:   Post-operative Plan: Extubation in OR  Informed Consent: I have reviewed the patients History and Physical, chart, labs and discussed the procedure including the risks, benefits and alternatives for the proposed anesthesia with the patient or authorized representative who has indicated his/her understanding and acceptance.   Dental advisory given  Plan Discussed with: CRNA, Anesthesiologist and Surgeon  Anesthesia Plan Comments:         Anesthesia Quick Evaluation

## 2014-02-17 NOTE — Consult Note (Signed)
I have seen and examined the pt and agree with PA-Dort's admission note. All risks and benefits were discussed with the patient to generally include: infection, bleeding, damage to surrounding structure, possible ileus, possible need for further surgery.. Alternatives were offered and described.  All questions were answered and the patient voiced understanding of the procedure and wishes to proceed at this point with a laparoscopic appendectomy.

## 2014-02-17 NOTE — ED Notes (Signed)
Pt moved to room 28 pt instructed npo and verbalized understanding

## 2014-02-17 NOTE — Progress Notes (Signed)
Attempted to call report to short stay area twice, no answer. Called [phone] once and [phone] once.

## 2014-02-17 NOTE — ED Provider Notes (Signed)
Miguel Thornton is a 43 y.o. male who presents to Urgent Care today for abdominal pain. Patient has 2 day history of decreased appetite constipation and abdominal pain. He denies any vomiting or diarrhea but does note nausea. He had one hard stool this morning. He denies any fevers or chills. He denies any chest pains palpitations or shortness of breath. He takes Aleve daily for ankle pain. The pain is located in the epigastric area.   History reviewed. No pertinent past medical history. History  Substance Use Topics  . Smoking status: Never Smoker   . Smokeless tobacco: Not on file  . Alcohol Use: No   ROS as above Medications: No current facility-administered medications for this encounter.   Current Outpatient Prescriptions  Medication Sig Dispense Refill  . dexamethasone (DECADRON) 0.1 % ophthalmic suspension Place 1 drop into both eyes 2 (two) times daily.  15 mL  0  . tobramycin (TOBREX) 0.3 % ophthalmic solution every 4 (four) hours.      Marland Kitchen tobramycin (TOBREX) 0.3 % ophthalmic solution Place 1 drop into the right eye every 6 (six) hours. X 5 days  5 mL  0    Exam:  BP 132/73  Pulse 84  Temp(Src) 98.6 F (37 C) (Oral)  Resp 16  SpO2 98% Gen: Well NAD nontoxic appearing HEENT: EOMI,  MMM Lungs: Normal work of breathing. CTABL Heart: RRR no MRG Abd: NABS, Soft. Nondistended, mildly tender to palpation bilateral lower corner and its without rebound or guarding. No CVA tenderness to percussion Exts: Brisk capillary refill, warm and well perfused.   Results for orders placed during the hospital encounter of 02/17/14 (from the past 24 hour(s))  CBC     Status: None   Collection Time    02/17/14  8:34 AM      Result Value Ref Range   WBC 10.5  4.0 - 10.5 K/uL   RBC 4.87  4.22 - 5.81 MIL/uL   Hemoglobin 14.9  13.0 - 17.0 g/dL   HCT 48.2  50.0 - 37.0 %   MCV 89.7  78.0 - 100.0 fL   MCH 30.6  26.0 - 34.0 pg   MCHC 34.1  30.0 - 36.0 g/dL   RDW 48.8  89.1 - 69.4 %   Platelets  190  150 - 400 K/uL  COMPREHENSIVE METABOLIC PANEL     Status: Abnormal   Collection Time    02/17/14  8:34 AM      Result Value Ref Range   Sodium 136 (*) 137 - 147 mEq/L   Potassium 4.2  3.7 - 5.3 mEq/L   Chloride 97  96 - 112 mEq/L   CO2 28  19 - 32 mEq/L   Glucose, Bld 166 (*) 70 - 99 mg/dL   BUN 8  6 - 23 mg/dL   Creatinine, Ser 5.03  0.50 - 1.35 mg/dL   Calcium 9.5  8.4 - 88.8 mg/dL   Total Protein 7.8  6.0 - 8.3 g/dL   Albumin 4.0  3.5 - 5.2 g/dL   AST 19  0 - 37 U/L   ALT 29  0 - 53 U/L   Alkaline Phosphatase 55  39 - 117 U/L   Total Bilirubin 1.0  0.3 - 1.2 mg/dL   GFR calc non Af Amer >90  >90 mL/min   GFR calc Af Amer >90  >90 mL/min   Anion gap 11  5 - 15  LIPASE, BLOOD     Status: None  Collection Time    02/17/14  8:34 AM      Result Value Ref Range   Lipase 14  11 - 59 U/L  POCT URINALYSIS DIP (DEVICE)     Status: Abnormal   Collection Time    02/17/14  8:51 AM      Result Value Ref Range   Glucose, UA NEGATIVE  NEGATIVE mg/dL   Bilirubin Urine NEGATIVE  NEGATIVE   Ketones, ur TRACE (*) NEGATIVE mg/dL   Specific Gravity, Urine 1.020  1.005 - 1.030   Hgb urine dipstick TRACE (*) NEGATIVE   pH 6.0  5.0 - 8.0   Protein, ur 30 (*) NEGATIVE mg/dL   Urobilinogen, UA 2.0 (*) 0.0 - 1.0 mg/dL   Nitrite NEGATIVE  NEGATIVE   Leukocytes, UA NEGATIVE  NEGATIVE   Dg Abd 1 View  02/17/2014   CLINICAL DATA:  Abdominal pain.  EXAM: ABDOMEN - 1 VIEW  COMPARISON:  None.  FINDINGS: A focal loop of dilated bowel is present in the left abdomen. This measures up to 3.5 cm. Gas is present throughout the colon. Small bowel is otherwise relatively gasless.  IMPRESSION: Focal dilated loop of small bowel in the left lower quadrant may represent partial obstruction or ileus. Gas is present in the more distal bowel.   Electronically Signed   By: Gennette Pachris  Mattern M.D.   On: 02/17/2014 08:52    Assessment and Plan: 43 y.o. male with small bowel obstruction. X-ray concerning for small  bowel obstruction. Physical exam is also consistent with this finding. Plan to transfer patient to the emergency room via shuttle for further evaluation and management. Additionally patient has elevated blood sugar. Recommend followup with a primary care provider for evaluation of this issue further.  Discussed warning signs or symptoms. Please see discharge instructions. Patient expresses understanding.   This note was created using Conservation officer, historic buildingsDragon voice recognition software. Any transcription errors are unintended.    Rodolph BongEvan S Abbegail Matuska, MD 02/17/14 [phone]

## 2014-02-17 NOTE — ED Provider Notes (Signed)
CSN: [id number]     Arrival date & time 02/17/14  [id number] History   First MD Initiated Contact with Patient 02/17/14 1134     Chief Complaint  Patient presents with  . Abdominal Pain     (Consider location/radiation/quality/duration/timing/severity/associated sxs/prior Treatment) HPI Patient presents to the emergency department with abdominal pain, that started on Sunday.  The patient, states, that he started not feeling well Sunday and noticed that he had decreased appetite and had not had a bowel movement in several days.  Patient, states, that his main pain started on the right side, but now had some left sided soreness as well.  The patient, states, that he has not had any known fevers.  The patient denies chest pain, shortness of breath, back pain, headache, blurred vision, dysuria, weakness, dizziness, rash, hematemesis, hematuria, bloody stool, diarrhea, or syncope.  He states he took a Zantac with some relief of his symptoms, but not complete relief History reviewed. No pertinent past medical history. History reviewed. No pertinent past surgical history. No family history on file. History  Substance Use Topics  . Smoking status: Never Smoker   . Smokeless tobacco: Not on file  . Alcohol Use: No    Review of Systems  All other systems negative except as documented in the HPI. All pertinent positives and negatives as reviewed in the HPI.  Allergies  Review of patient's allergies indicates no known allergies.  Home Medications   Prior to Admission medications   Medication Sig Start Date End Date Taking? Authorizing Provider  naproxen sodium (ANAPROX) 220 MG tablet Take 440 mg by mouth 2 (two) times daily with a meal.   Yes Historical Provider, MD   BP 142/83  Pulse 71  Temp(Src) 98.3 F (36.8 C) (Oral)  Resp 26  Ht 5\' 7"  (1.702 m)  Wt 190 lb (86.183 kg)  BMI 29.75 kg/m2  SpO2 100% Physical Exam  Constitutional: He appears well-developed and well-nourished. No distress.    HENT:  Head: Normocephalic and atraumatic.  Mouth/Throat: Oropharynx is clear and moist.  Eyes: Pupils are equal, round, and reactive to light.  Neck: Normal range of motion. Neck supple.  Cardiovascular: Normal rate, regular rhythm and normal heart sounds.  Exam reveals no gallop and no friction rub.   No murmur heard. Pulmonary/Chest: Effort normal and breath sounds normal.  Abdominal: Soft. Normal appearance and bowel sounds are normal. He exhibits no distension. There is tenderness. There is guarding. There is no rebound and no CVA tenderness. No hernia.      ED Course  Procedures (including critical care time) Labs Review Labs Reviewed - No data to display  Imaging Review Dg Abd 1 View  02/17/2014   CLINICAL DATA:  Abdominal pain.  EXAM: ABDOMEN - 1 VIEW  COMPARISON:  None.  FINDINGS: A focal loop of dilated bowel is present in the left abdomen. This measures up to 3.5 cm. Gas is present throughout the colon. Small bowel is otherwise relatively gasless.  IMPRESSION: Focal dilated loop of small bowel in the left lower quadrant may represent partial obstruction or ileus. Gas is present in the more distal bowel.   Electronically Signed   By: Gennette Pachris  Mattern M.D.   On: 02/17/2014 08:52   Ct Abdomen Pelvis W Contrast  02/17/2014   CLINICAL DATA:  Abdominal pain, loss of appetite, constipation  EXAM: CT ABDOMEN AND PELVIS WITH CONTRAST  TECHNIQUE: Multidetector CT imaging of the abdomen and pelvis was performed using the standard protocol following bolus  administration of intravenous contrast.  CONTRAST:  97mL OMNIPAQUE IOHEXOL 300 MG/ML  SOLN  COMPARISON:  None.  FINDINGS: Ingested enteric contrast extends to the level of the distal small bowel. The appendix is enlarged measuring approximately 11 mm in diameter (coronal image 57, series 5) with associated adjacent periappendiceal mesenteric stranding. No evidence of perforation or definable/ drainable fluid collection. Shotty reactive  mesenteric lymph nodes within the right lower abdominal quadrant are not enlarged by size criteria with index precaval lymph node measuring approximately 0.7 cm in greatest short axis diameter.  Moderate colonic stool burden without evidence of enteric obstruction.  Normal hepatic contour. There is mild diffuse decreased attenuation of the hepatic parenchyma on this postcontrast examination suggestive of hepatic steatosis. No discrete hepatic lesions. No radiopaque gallstones. No intra or extrahepatic biliary duct dilatation. No ascites.  There is symmetric enhancement and excretion of the bilateral kidneys. No definite renal stones on this postcontrast examination. No discrete renal lesions. No urinary obstruction or perinephric stranding. Normal appearance of the bilateral adrenal glands, pancreas and spleen.  Scatter minimal mixed calcified and noncalcified atherosclerotic plaque within a normal caliber abdominal aorta. The major branch vessels of the abdominal aorta appear widely patent on this non CTA examination. No retroperitoneal, mesenteric, pelvic or inguinal lymphadenopathy.  Normal appearance of the pelvic organs. Normal appearance of the urinary bladder given degree distention.  Limited visualization of the lower thorax demonstrates minimal grossly symmetric deep tendon subpleural ground-glass atelectasis. No discrete focal airspace opacities. No pleural effusion. Normal heart size. No pericardial effusion.  No acute or aggressive osseous abnormalities. 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 Regional soft tissues appear normal.  IMPRESSION: 1. Findings compatible with acute, uncomplicated appendicitis. No evidence of perforation or drainable/definable fluid collection. 2. Moderate colonic stool burden without evidence of enteric obstruction. 3. Hepatic steatosis suspected on this postcontrast examination. Correlation  with LFTs is recommended.   Electronically Signed   By: Simonne Come M.D.   On: 02/17/2014 13:42    I spoke with general surgery, about the patient and they will be down to evaluate for operative care of his appendicitis.  Patient is given the results, and told the plan and all questions were answered.    Carlyle Dolly, PA-C 02/17/14 1406

## 2014-02-18 MED ORDER — DOCUSATE SODIUM 100 MG PO CAPS: 100.0000 mg | ORAL_CAPSULE | Freq: Two times a day (BID) | ORAL | Status: DC | PRN

## 2014-02-18 MED ORDER — WHITE PETROLATUM GEL
Status: AC
  Filled 2014-02-18: qty 5

## 2014-02-18 MED ORDER — OXYCODONE-ACETAMINOPHEN 5-325 MG PO TABS: 1.0000 | ORAL_TABLET | Freq: Four times a day (QID) | ORAL | Status: DC | PRN

## 2014-02-18 NOTE — Progress Notes (Signed)
AVS discharge instructions were given and went over with patient. Patient was also given prescription for percocet to take to his pharmacy. Patient was told and given information of when and how to make follow up appointment with [facility]. Patient stated that he did not have any questions. Patient was able to ambulate with his mother to their transportation.

## 2014-02-18 NOTE — Discharge Summary (Signed)
Agree with summary. 

## 2014-02-18 NOTE — Progress Notes (Signed)
1 Day Post-Op  Subjective: Sore.  Tolerating liquids.  Walking to bathroom  Objective: Vital signs in last 24 hours: Temp:  [98.3 F (36.8 C)-100.1 F (37.8 C)] 98.6 F (37 C) (08/05 0510) Pulse Rate:  [71-101] 82 (08/05 0510) Resp:  [13-26] 18 (08/05 0510) BP: (105-142)/(62-94) 105/62 mmHg (08/05 0510) SpO2:  [96 %-100 %] 99 % (08/05 0510) Weight:  [190 lb (86.183 kg)] 190 lb (86.183 kg) (08/04 1020) Last BM Date: 02/16/14  Intake/Output from previous day: 08/04 0701 - 08/05 0700 In: 3020 [I.V.:2720; IV Piggyback:300] Out: 250 [Urine:250] Intake/Output this shift:    PE: General- In NAD Abdomen-soft, dried drainage on dressings  Lab Results:   Recent Labs  02/17/14 0834  WBC 10.5  HGB 14.9  HCT 43.7  PLT 190   BMET  Recent Labs  02/17/14 0834  NA 136*  K 4.2  CL 97  CO2 28  GLUCOSE 166*  BUN 8  CREATININE 0.88  CALCIUM 9.5   PT/INR No results found for this basename: LABPROT, INR,  in the last 72 hours Comprehensive Metabolic Panel:    Component Value Date/Time   NA 136* 02/17/2014 0834   K 4.2 02/17/2014 0834   CL 97 02/17/2014 0834   CO2 28 02/17/2014 0834   BUN 8 02/17/2014 0834   CREATININE 0.88 02/17/2014 0834   GLUCOSE 166* 02/17/2014 0834   CALCIUM 9.5 02/17/2014 0834   AST 19 02/17/2014 0834   ALT 29 02/17/2014 0834   ALKPHOS 55 02/17/2014 0834   BILITOT 1.0 02/17/2014 0834   PROT 7.8 02/17/2014 0834   ALBUMIN 4.0 02/17/2014 0834     Studies/Results: Dg Abd 1 View  02/17/2014   CLINICAL DATA:  Abdominal pain.  EXAM: ABDOMEN - 1 VIEW  COMPARISON:  None.  FINDINGS: A focal loop of dilated bowel is present in the left abdomen. This measures up to 3.5 cm. Gas is present throughout the colon. Small bowel is otherwise relatively gasless.  IMPRESSION: Focal dilated loop of small bowel in the left lower quadrant may represent partial obstruction or ileus. Gas is present in the more distal bowel.   Electronically Signed   By: Gennette Pac M.D.   On: 02/17/2014 08:52    Ct Abdomen Pelvis W Contrast  02/17/2014   CLINICAL DATA:  Abdominal pain, loss of appetite, constipation  EXAM: CT ABDOMEN AND PELVIS WITH CONTRAST  TECHNIQUE: Multidetector CT imaging of the abdomen and pelvis was performed using the standard protocol following bolus administration of intravenous contrast.  CONTRAST:  109mL OMNIPAQUE IOHEXOL 300 MG/ML  SOLN  COMPARISON:  None.  FINDINGS: Ingested enteric contrast extends to the level of the distal small bowel. The appendix is enlarged measuring approximately 11 mm in diameter (coronal image 57, series 5) with associated adjacent periappendiceal mesenteric stranding. No evidence of perforation or definable/ drainable fluid collection. Shotty reactive mesenteric lymph nodes within the right lower abdominal quadrant are not enlarged by size criteria with index precaval lymph node measuring approximately 0.7 cm in greatest short axis diameter.  Moderate colonic stool burden without evidence of enteric obstruction.  Normal hepatic contour. There is mild diffuse decreased attenuation of the hepatic parenchyma on this postcontrast examination suggestive of hepatic steatosis. No discrete hepatic lesions. No radiopaque gallstones. No intra or extrahepatic biliary duct dilatation. No ascites.  There is symmetric enhancement and excretion of the bilateral kidneys. No definite renal stones on this postcontrast examination. No discrete renal lesions. No urinary obstruction or perinephric stranding. Normal appearance  of the bilateral adrenal glands, pancreas and spleen.  Scatter minimal mixed calcified and noncalcified atherosclerotic plaque within a normal caliber abdominal aorta. The major branch vessels of the abdominal aorta appear widely patent on this non CTA examination. No retroperitoneal, mesenteric, pelvic or inguinal lymphadenopathy.  Normal appearance of the pelvic organs. Normal appearance of the urinary bladder given degree distention.  Limited visualization of  the lower thorax demonstrates minimal grossly symmetric deep tendon subpleural ground-glass atelectasis. No discrete focal airspace opacities. No pleural effusion. Normal heart size. No pericardial effusion.  No acute or aggressive osseous abnormalities. 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 Regional soft tissues appear normal.  IMPRESSION: 1. Findings compatible with acute, uncomplicated appendicitis. No evidence of perforation or drainable/definable fluid collection. 2. Moderate colonic stool burden without evidence of enteric obstruction. 3. Hepatic steatosis suspected on this postcontrast examination. Correlation with LFTs is recommended.   Electronically Signed   By: Simonne ComeJohn  Watts M.D.   On: 02/17/2014 13:42    Anti-infectives: Anti-infectives   Start     Dose/Rate Route Frequency Ordered Stop   02/17/14 2145  piperacillin-tazobactam (ZOSYN) IVPB 3.375 g     3.375 g 12.5 mL/hr over 240 Minutes Intravenous Every 8 hours 02/17/14 2125 02/18/14 0147   02/17/14 1445  metroNIDAZOLE (FLAGYL) IVPB 500 mg     500 mg 100 mL/hr over 60 Minutes Intravenous Every 8 hours 02/17/14 1434     02/17/14 1445  cefTRIAXone (ROCEPHIN) 2 g in dextrose 5 % 50 mL IVPB    Comments:  Pharmacy may adjust dosing strength / duration / interval for maximal efficacy   2 g 100 mL/hr over 30 Minutes Intravenous Every 24 hours 02/17/14 1434        Assessment Active Problems:   Acute appendicitis s/p lap. Appendectomy 02/17/14-stable overnight; tolerating liquids; ambulating    LOS: 1 day   Plan: Possibly home later today if diet tolerated.   Oanh Devivo J 02/18/2014

## 2014-02-18 NOTE — Discharge Summary (Signed)
[facility] Discharge Summary   Patient ID: Miguel Thornton MRN: [id number] DOB/AGE: 1971-06-06 43 y.o.  Admit date: 02/17/2014 Discharge date: 02/18/2014  Admitting Diagnosis: Acute appendicitis  Discharge Diagnosis Patient Active Problem List   Diagnosis Date Noted  . Appendicitis 02/17/2014    Consultants None  Imaging: Dg Abd 1 View  02/17/2014   CLINICAL DATA:  Abdominal pain.  EXAM: ABDOMEN - 1 VIEW  COMPARISON:  None.  FINDINGS: A focal loop of dilated bowel is present in the left abdomen. This measures up to 3.5 cm. Gas is present throughout the colon. Small bowel is otherwise relatively gasless.  IMPRESSION: Focal dilated loop of small bowel in the left lower quadrant may represent partial obstruction or ileus. Gas is present in the more distal bowel.   Electronically Signed   By: Gennette Pac M.D.   On: 02/17/2014 08:52   Ct Abdomen Pelvis W Contrast  02/17/2014   CLINICAL DATA:  Abdominal pain, loss of appetite, constipation  EXAM: CT ABDOMEN AND PELVIS WITH CONTRAST  TECHNIQUE: Multidetector CT imaging of the abdomen and pelvis was performed using the standard protocol following bolus administration of intravenous contrast.  CONTRAST:  6mL OMNIPAQUE IOHEXOL 300 MG/ML  SOLN  COMPARISON:  None.  FINDINGS: Ingested enteric contrast extends to the level of the distal small bowel. The appendix is enlarged measuring approximately 11 mm in diameter (coronal image 57, series 5) with associated adjacent periappendiceal mesenteric stranding. No evidence of perforation or definable/ drainable fluid collection. Shotty reactive mesenteric lymph nodes within the right lower abdominal quadrant are not enlarged by size criteria with index precaval lymph node measuring approximately 0.7 cm in greatest short axis diameter.  Moderate colonic stool burden without evidence of enteric obstruction.  Normal hepatic contour. There is mild diffuse decreased attenuation of the hepatic parenchyma  on this postcontrast examination suggestive of hepatic steatosis. No discrete hepatic lesions. No radiopaque gallstones. No intra or extrahepatic biliary duct dilatation. No ascites.  There is symmetric enhancement and excretion of the bilateral kidneys. No definite renal stones on this postcontrast examination. No discrete renal lesions. No urinary obstruction or perinephric stranding. Normal appearance of the bilateral adrenal glands, pancreas and spleen.  Scatter minimal mixed calcified and noncalcified atherosclerotic plaque within a normal caliber abdominal aorta. The major branch vessels of the abdominal aorta appear widely patent on this non CTA examination. No retroperitoneal, mesenteric, pelvic or inguinal lymphadenopathy.  Normal appearance of the pelvic organs. Normal appearance of the urinary bladder given degree distention.  Limited visualization of the lower thorax demonstrates minimal grossly symmetric deep tendon subpleural ground-glass atelectasis. No discrete focal airspace opacities. No pleural effusion. Normal heart size. No pericardial effusion.  No acute or aggressive osseous abnormalities. 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 Regional soft tissues appear normal.  IMPRESSION: 1. Findings compatible with acute, uncomplicated appendicitis. No evidence of perforation or drainable/definable fluid collection. 2. Moderate colonic stool burden without evidence of enteric obstruction. 3. Hepatic steatosis suspected on this postcontrast examination. Correlation with LFTs is recommended.   Electronically Signed   By: Simonne Come M.D.   On: 02/17/2014 13:42    Procedures Dr. Derrell Lolling (02/18/14) - Laparoscopic Appendectomy  Hospital Course:  43 y/o healthy male presents to [facility] with abdominal pain, that started on Sunday. The patient states not feeling well on sunday, anorexia and constipation for several days. C/o  chills, nausea but no vomiting. Only able  to keep done some liquids. Patient c/o pain on right which radiates to the left. Tried zantac without relief. The patient denies fever, CP/SOB, back pain, headache, blurred vision, dysuria, bloody stool, or diarrhea. He states he took a Zantac with some relief of his symptoms, but not complete relief.  No other alleviating or precipitating factors. 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 Never had surgery on his abdomen before or hernia.   Workup showed Acute appendicitis.  Patient was admitted and underwent procedure listed above.  Tolerated procedure well and was transferred to the floor.  Diet was advanced as tolerated.  On POD#1, the patient was voiding well, tolerating diet, ambulating well, pain well controlled, vital signs stable, incisions c/d/i and felt stable for discharge home.  Patient will follow up in our office in 3-4 weeks and knows to call with questions or concerns.  One of his post-op dressings was found to have dried blood so it was changed out prior to discharge.      Medication List         docusate sodium 100 MG capsule  Commonly known as:  COLACE  Take 1 capsule (100 mg total) by mouth 2 (two) times daily as needed for mild constipation.     naproxen sodium 220 MG tablet  Commonly known as:  ANAPROX  Take 440 mg by mouth 2 (two) times daily with a meal.     oxyCODONE-acetaminophen 5-325 MG per tablet  Commonly known as:  PERCOCET/ROXICET  Take 1-2 tablets by mouth every 6 (six) hours as needed for moderate pain.           Signed: Aris GeorgiaMegan Dort, Sanford BismarckA-C [facility] [phone]  02/18/2014, 11:34 AM

## 2014-02-18 NOTE — H&P (Signed)
[facility]  Admission Note  Miguel Thornton  May 20, 1971  [id number].   Requesting MD: Dr. Mingo Amber  Chief Complaint/Reason for Consult: Acute appendicitis   HPI:  43 y/o healthy male presents to [facility] with abdominal pain, that started on Sunday. The patient states not feeling well on sunday, anorexia and constipation for several days. C/o chills, nausea but no vomiting. Only able to keep done some liquids. Patient c/o pain on right which radiates to the left. Tried zantac without relief. The patient denies fever, CP/SOB, back pain, headache, blurred vision, dysuria, bloody stool, or diarrhea. He states he took a Zantac with some relief of his symptoms, but not complete relief. No other alleviating or precipitating factors. Works in a [employer] teacher. Never had surgery on his abdomen before or hernia.   ROS:  All systems reviewed and otherwise negative except for as above  No family history on file.  History reviewed. No pertinent past medical history.  History reviewed. No pertinent past surgical history.  Social History: reports that he has never smoked. He does not have any smokeless tobacco history on file. He reports that he does not drink alcohol or use illicit drugs.  Allergies: No Known Allergies   (Not in a hospital admission)  Blood pressure 142/83, pulse 71, temperature 98.3 F (36.8 C), temperature source Oral, resp. rate 26, height _0  (1.702 m), weight 190 lb (86.183 kg), SpO2 100.00%.   Physical Exam:  General: pleasant, WD/WN white male who is laying in bed in NAD  HEENT: head is normocephalic, atraumatic. Sclera are noninjected. PERRL. Ears and nose without any masses or lesions. Mouth is pink and moist  Heart: regular, rate, and rhythm. No obvious murmurs, gallops, or rubs noted. Palpable pedal pulses bilaterally  Lungs: CTAB, no wheezes, rhonchi, or rales noted. Respiratory effort nonlabored  Abd: soft, ND, tender to palpation in RLQ at  McBurney's point, +BS, no masses, hernias, or organomegaly, no surgical scars, no rebound/guarding  MS: all 4 extremities are symmetrical with no cyanosis, clubbing, or edema.  Skin: warm and dry with no masses, lesions, or rashes  Psych: A&Ox3 with an appropriate affect.    Results for orders placed during the hospital encounter of 02/17/14 (from the past 48 hour(s))   CBC Status: None    Collection Time    02/17/14 8:34 AM   Result  Value  Ref Range    WBC  10.5  4.0 - 10.5 K/uL    RBC  4.87  4.22 - 5.81 MIL/uL    Hemoglobin  14.9  13.0 - 17.0 g/dL    HCT  43.7  39.0 - 52.0 %    MCV  89.7  78.0 - 100.0 fL    MCH  30.6  26.0 - 34.0 pg    MCHC  34.1  30.0 - 36.0 g/dL    RDW  12.1  11.5 - 15.5 %    Platelets  190  150 - 400 K/uL   COMPREHENSIVE METABOLIC PANEL Status: Abnormal    Collection Time    02/17/14 8:34 AM   Result  Value  Ref Range    Sodium  136 (*)  137 - 147 mEq/L    Potassium  4.2  3.7 - 5.3 mEq/L    Chloride  97  96 - 112 mEq/L    CO2  28  19 - 32 mEq/L    Glucose, Bld  166 (*)  70 - 99 mg/dL    BUN  8  6 - 23 mg/dL    Creatinine, Ser  0.88  0.50 - 1.35 mg/dL    Calcium  9.5  8.4 - 10.5 mg/dL    Total Protein  7.8  6.0 - 8.3 g/dL    Albumin  4.0  3.5 - 5.2 g/dL    AST  19  0 - 37 U/L    ALT  29  0 - 53 U/L    Alkaline Phosphatase  55  39 - 117 U/L    Total Bilirubin  1.0  0.3 - 1.2 mg/dL    GFR calc non Af Amer  >90  >90 mL/min    GFR calc Af Amer  >90  >90 mL/min    Comment:  (NOTE)     The eGFR has been calculated using the CKD EPI equation.     This calculation has not been validated in all clinical situations.     eGFR's persistently <90 mL/min signify possible Chronic Kidney     Disease.    Anion gap  11  5 - 15   LIPASE, BLOOD Status: None    Collection Time    02/17/14 8:34 AM   Result  Value  Ref Range    Lipase  14  11 - 59 U/L   POCT URINALYSIS DIP (DEVICE) Status: Abnormal    Collection Time    02/17/14 8:51 AM   Result  Value  Ref Range     Glucose, UA  NEGATIVE  NEGATIVE mg/dL    Bilirubin Urine  NEGATIVE  NEGATIVE    Ketones, ur  TRACE (*)  NEGATIVE mg/dL    Specific Gravity, Urine  1.020  1.005 - 1.030    Hgb urine dipstick  TRACE (*)  NEGATIVE    pH  6.0  5.0 - 8.0    Protein, ur  30 (*)  NEGATIVE mg/dL    Urobilinogen, UA  2.0 (*)  0.0 - 1.0 mg/dL    Nitrite  NEGATIVE  NEGATIVE    Leukocytes, UA  NEGATIVE  NEGATIVE    Comment:  Biochemical Testing Only. Please order routine urinalysis from main lab if confirmatory testing is needed.    Dg Abd 1 View  02/17/2014 CLINICAL DATA: Abdominal pain. EXAM: ABDOMEN - 1 VIEW COMPARISON: None. FINDINGS: A focal loop of dilated bowel is present in the left abdomen. This measures up to 3.5 cm. Gas is present throughout the colon. Small bowel is otherwise relatively gasless. IMPRESSION: Focal dilated loop of small bowel in the left lower quadrant may represent partial obstruction or ileus. Gas is present in the more distal bowel. Electronically Signed By: Lawrence Santiago M.D. On: 02/17/2014 08:52   Ct Abdomen Pelvis W Contrast  02/17/2014 CLINICAL DATA: Abdominal pain, loss of appetite, constipation EXAM: CT ABDOMEN AND PELVIS WITH CONTRAST TECHNIQUE: Multidetector CT imaging of the abdomen and pelvis was performed using the standard protocol following bolus administration of intravenous contrast. CONTRAST: 54m OMNIPAQUE IOHEXOL 300 MG/ML SOLN COMPARISON: None. FINDINGS: Ingested enteric contrast extends to the level of the distal small bowel. The appendix is enlarged measuring approximately 11 mm in diameter (coronal image 57, series 5) with associated adjacent periappendiceal mesenteric stranding. No evidence of perforation or definable/ drainable fluid collection. Shotty reactive mesenteric lymph nodes within the right lower abdominal quadrant are not enlarged by size criteria with index precaval lymph node measuring approximately 0.7 cm in greatest short axis diameter. Moderate colonic stool  burden without evidence of enteric obstruction. Normal hepatic contour.  There is mild diffuse decreased attenuation of the hepatic parenchyma on this postcontrast examination suggestive of hepatic steatosis. No discrete hepatic lesions. No radiopaque gallstones. No intra or extrahepatic biliary duct dilatation. No ascites. There is symmetric enhancement and excretion of the bilateral kidneys. No definite renal stones on this postcontrast examination. No discrete renal lesions. No urinary obstruction or perinephric stranding. Normal appearance of the bilateral adrenal glands, pancreas and spleen. Scatter minimal mixed calcified and noncalcified atherosclerotic plaque within a normal caliber abdominal aorta. The major branch vessels of the abdominal aorta appear widely patent on this non CTA examination. No retroperitoneal, mesenteric, pelvic or inguinal lymphadenopathy. Normal appearance of the pelvic organs. Normal appearance of the urinary bladder given degree distention. Limited visualization of the lower thorax demonstrates minimal grossly symmetric deep tendon subpleural ground-glass atelectasis. No discrete focal airspace opacities. No pleural effusion. Normal heart size. No pericardial effusion. No acute or aggressive osseous abnormalities. 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 Regional soft tissues appear normal. IMPRESSION: 1. Findings compatible with acute, uncomplicated appendicitis. No evidence of perforation or drainable/definable fluid collection. 2. Moderate colonic stool burden without evidence of enteric obstruction. 3. Hepatic steatosis suspected on this postcontrast examination. Correlation with LFTs is recommended. Electronically Signed By: Sandi Mariscal M.D. On: 02/17/2014 13:42    Assessment/Plan  Acute appendicitis   Plan:  1. Admit to [facility] for urgent lap appy  2. NPO, bowel rest, IVF, pain control,  antiemetics, antibiotics (rocephin and flagyl)  3. SCD's and lovenox for DVT proph  4. Ambulate and IS   Coralie Keens, [facility]  02/17/2014, 2:09 PM  Pager: [phone]

## 2014-02-19 ENCOUNTER — Encounter (HOSPITAL_COMMUNITY): Payer: Self-pay | Admitting: General Surgery

## 2014-02-19 NOTE — H&P (Signed)
I have seen and examined the pt and agree with PA-Dort's note. Acute appendicitis We'll proceed to the operating room for lap appendectomy I discussed with the patient the risks and benefits of the procedure to include but not limited to: Infection, bleeding, damage to structures, possible ileus, possible need for further surgery.

## 2014-03-18 ENCOUNTER — Encounter (INDEPENDENT_AMBULATORY_CARE_PROVIDER_SITE_OTHER): Payer: Self-pay | Admitting: General Surgery

## 2014-04-03 ENCOUNTER — Encounter (INDEPENDENT_AMBULATORY_CARE_PROVIDER_SITE_OTHER): Payer: Self-pay | Admitting: General Surgery

## 2016-02-22 DIAGNOSIS — I219 Acute myocardial infarction, unspecified: Secondary | ICD-10-CM

## 2016-02-22 HISTORY — DX: Acute myocardial infarction, unspecified: I21.9

## 2016-05-21 DIAGNOSIS — I25119 Atherosclerotic heart disease of native coronary artery with unspecified angina pectoris: Secondary | ICD-10-CM | POA: Diagnosis present

## 2016-05-21 NOTE — H&P (Signed)
OFFICE VISIT NOTES COPIED TO EPIC FOR DOCUMENTATION  . History of Present Illness Miguel Thornton Miguel Thornton; 04/28/2016 11:52 AM) The patient is a 45 year old male who presents for a Follow-up for Chest pain. He was recently diagnosed on 02/22/2016 with diabetes. He presented for evaluation due to fatigue, malaise, and atypical chest pain. HbA1c at that time was 10.4%. He states he has made significant lifestyle changes and is motivated to control his diabetes with diet. He is presently only on metformin for diabetes and atorvastatin was started for hyperlipidemia.  Denies any exertional component to chest pain, however did report dyspnea and fatigue with exertion. Denies any associated nausea, diaphoresis, or dizziness. No PND, orthopnea, edema, syncope, or symptoms suggestive of claudication or TIA. He does report intermittent sensation of tingling on the bottoms of his feet which he had attributed to being on his feet mostly on concrete floors at work all day.  ABIs were normal bilaterally but with biphasic waveforms, echocardiogram essentially normal with normal LVEF. Stress testing, however revealed a mild to moderate severe ischemia in the apical septal wall with depressed LVEF at 39%, however, he was able to perform at 12.10 METs with normal blood pressure response and no complaints of chest pain. We discussed his options at length, specifically proceeding with coronary angiogram versus medical therapy. As symptoms were mild and stable, he prefered medical therapy for now. I added Coreg 6.25 mg twice a day and increased atorvastatin to 40 mg daily. He presents today for follow up.   Problem List/Past Medical Miguel Thornton Thornton; 04/28/2016 10:02 AM) Atypical chest pain (R07.89)  Exercise sestamibi stress test 04/07/2016: 1. The resting electrocardiogram demonstrated normal sinus rhythm, normal resting conduction, no resting arrhythmias and normal rest repolarization. The stress  electrocardiogram was normal. Non specific ST changes at peak normalized immediately in recovery. Patient exercised on Bruce protocol for 10:11 minutes and achieved 12.10 METS. Stress test terminated due to 86% MPHR achieved (Target HR >85%). Symptoms included fatigue and dyspnea. Excellent exercise tolerance, normal blood pressure response. 2. The perfusion imaging study demonstrates a small to at most moderate-sized severe ischemia in the anteroseptal no and apical septum wall extending from the mid ventricle. Left ventricular systolic function calculating QGS was moderately depressed at 39%. This represents an intermediate risk study, in view of excellent exercise tolerance, clinical correlation recommended. Diabetes mellitus, new onset (E11.9)  ABI 04/06/2016: This exam reveals normal perfusion of the lower extremity. RABI 0.98 with biphasic wavefroms and LABI 1.02 with biphasic waveform in the PT artery. Labwork  02/22/2016: HbA1c 10.4%, total cholesterol 222, triglycerides 148, HDL 45, LDL 147, glucose 281, creatinine 0.9, potassium 4.8, ALT 54, CMP otherwise normal, CBC normal Dyspnea on exertion (R06.09)  Echocardiogram 04/06/2016: Left ventricle cavity is normal in size. Normal global wall motion. Low normal LV systolic function. Normal diastolic filling pattern. Visual EF is 50-55%. Mild tricuspid regurgitation. No evidence of pulmonary hypertension. Pulmonary artery systolic pressure is estimated at 24 mm Hg. Mild pulmonic regurgitation. IVC is normal with poor inspiration collapse consistent with elevated right atrial pressure. Hyperlipidemia, group A (E78.00)  Abnormal nuclear stress test (R94.39)   Allergies (Miguel Thornton; 04/28/2016 10:02 AM) No Known Allergies 03/10/2016  Family History (Miguel Thornton; 04/28/2016 10:02 AM) Mother  Living, no known heart conditions, diabetes, htn Father  Deceased. at age 16, from accident, no known heart conditions Sister 1  younger, no  known heart conditions  Social History Miguel Thornton Thornton; 04/28/2016 10:02 AM) Current tobacco use  Former smoker.  quit 10 years ago, smoked for about 15 years Non Drinker/No Alcohol Use  Marital status  Married. Living Situation  Lives with spouse. Number of Children  2. 1 passed from car accident, one living, healthy  Past Surgical History Miguel Thornton(Miguel Thornton; 04/28/2016 10:02 AM) Appendectomy 2015  Medication History (Miguel Thornton; 04/28/2016 10:14 AM) Atorvastatin Calcium (40MG  Tablet, 1 Tablet Oral daily, Taken starting 04/14/2016) Active. (Note dose change) Coreg (6.25MG  Tablet, 1 (one) Tablet Oral two times daily, Taken starting 04/14/2016) Active. MetFORMIN HCl ER (MOD) (500MG  Tablet ER 24HR, 1 Oral two times daily) Active. Aspirin (81MG  Tablet Chewable, 1 Oral daily) Active. Medications Reconciled (verbally w/ pt)  Diagnostic Studies History Miguel Thornton(Miguel Thornton; 04/28/2016 10:02 AM) ABI's 04/06/2016 This exam reveals normal perfusion of the lower extremity. RABI 0.98 with biphasic wavefroms and LABI 1.02 with biphasic waveform in the PT artery. Echocardiogram 04/06/2016 Left ventricle cavity is normal in size. Normal global wall motion. Low normal LV systolic function. Normal diastolic filling pattern. Visual EF is 50-55%. Mild tricuspid regurgitation. No evidence of pulmonary hypertension. Pulmonary artery systolic pressure is estimated at 24 mm Hg. Mild pulmonic regurgitation. IVC is normal with poor inspiration collapse consistent with elevated right atrial pressure. Nuclear stress test 04/07/2016 1. The resting electrocardiogram demonstrated normal sinus rhythm, normal resting conduction, no resting arrhythmias and normal rest repolarization. The stress electrocardiogram was normal. Non specific ST changes at peak normalized immediately in recovery. Patient exercised on Bruce protocol for 10:11 minutes and achieved 12.10 METS. Stress test terminated due to 86% MPHR  achieved (Target HR >85%). Symptoms included fatigue and dyspnea. Excellent exercise tolerance, normal blood pressure response. 2. The perfusion imaging study demonstrates a small to at most moderate-sized severe ischemia in the anteroseptal no and apical septum wall extending from the mid ventricle. Left ventricular systolic function calculating QGS was moderately depressed at 39%. This represents an intermediate risk study, in view of excellent exercise tolerance, clinical correlation recommended.    Review of Systems Miguel Thornton(Miguel Thornton, [place]; 04/28/2016 12:29 PM) General Present- Fatigue. Not Present- Anorexia and Fever. Respiratory Present- Decreased Exercise Tolerance and Difficulty Breathing on Exertion. Not Present- Cough. Cardiovascular Present- Chest Pain. Not Present- Claudications, Edema, Orthopnea, Palpitations and Paroxysmal Nocturnal Dyspnea. Gastrointestinal Not Present- Black, Tarry Stool, Change in Bowel Habits and Nausea. Neurological Not Present- Focal Neurological Symptoms and Syncope. Endocrine Not Present- Cold Intolerance, Excessive Sweating, Heat Intolerance and Thyroid Problems. Hematology Not Present- Anemia, Easy Bruising, Petechiae and Prolonged Bleeding.  Vitals Miguel Thornton(Miguel Thornton; 04/28/2016 10:16 AM) 04/28/2016 10:06 AM Weight: 190.06 lb Height: 67in Body Surface Area: 1.98 m Body Mass Index: 29.77 kg/m  Pulse: 88 (Regular)  P.OX: 98% (Room air) BP: 106/78 (Sitting, Left Arm, Standard)       Physical Exam (Miguel Thornton, Miguel Thornton; 04/28/2016 12:29 PM) General Mental Status-Alert. General Appearance-Cooperative, Appears stated age, Not in acute distress. Build & Nutrition-Moderately built and Mildly obese.  Head and Neck Thyroid Gland Characteristics - no palpable nodules, no palpable enlargement.  Chest and Lung Exam Palpation Tender - No chest wall tenderness. Auscultation Breath sounds -  Clear.  Cardiovascular Inspection Jugular vein - Right - No Distention. Auscultation Heart Sounds - S1 WNL, S2 WNL and No gallop present. Murmurs & Other Heart Sounds - Murmur - No murmur.  Abdomen Palpation/Percussion Normal exam - Non Tender and No hepatosplenomegaly. Auscultation Normal exam - Bowel sounds normal.  Peripheral Vascular Lower Extremity Inspection - Bilateral - Inspection Normal. Palpation - Edema - Bilateral - No edema. Femoral pulse - Bilateral -  Normal. Popliteal pulse - Bilateral - Feeble. Dorsalis pedis pulse - Left - Feeble. Right - 1+. Bilateral - Normal. Posterior tibial pulse - Bilateral - Feeble. Carotid arteries - Bilateral-No Carotid bruit. Abdomen-No prominent abdominal aortic pulsation, No epigastric bruit.  Neurologic Neurologic evaluation reveals -alert and oriented x 3 with no impairment of recent or remote memory. Motor-Grossly intact without any focal deficits.  Musculoskeletal Global Assessment Left Lower Extremity - normal range of motion without pain. Right Lower Extremity - normal range of motion without pain.    Assessment & Plan (Miguel Revonda Standard Miguel Thornton; 04/28/2016 12:28 PM) Abnormal nuclear stress test (R94.39) Angina pectoris (I20.9) Story: Exercise sestamibi stress test 04/07/2016: 1. The resting electrocardiogram demonstrated normal sinus rhythm, normal resting conduction, no resting arrhythmias and normal rest repolarization. The stress electrocardiogram was normal. Non specific ST changes at peak normalized immediately in recovery. Patient exercised on Bruce protocol for 10:11 minutes and achieved 12.10 METS. Stress test terminated due to 86% MPHR achieved (Target HR >85%). Symptoms included fatigue and dyspnea. Excellent exercise tolerance, normal blood pressure response. 2. The perfusion imaging study demonstrates a small to at most moderate-sized severe ischemia in the anteroseptal no and apical septum wall extending from  the mid ventricle. Left ventricular systolic function calculating QGS was moderately depressed at 39%. This represents an intermediate risk study, in view of excellent exercise tolerance, clinical correlation recommended. Impression: EKG 05/15/2016: Normal sinus rhythm at rate of 62 bpm, normal axis. Nonspecific ST depression and T-wave flattening in lead 3 isolated lead. Compared to EKG 03/10/2016, T change new. Current Plans Started Nitroglycerin 0.4MG , 1 (one) Tablet every 5 minutes as needed for chest pain., #24, 04/28/2016, Ref. x2. Diabetes mellitus, new onset (E11.9) Story: ABI 04/06/2016: This exam reveals normal perfusion of the lower extremity. RABI 0.98 with biphasic wavefroms and LABI 1.02 with biphasic waveform in the PT artery. Labwork Story: 02/22/2016: HbA1c 10.4%, total cholesterol 222, triglycerides 148, HDL 45, LDL 147, glucose 281, creatinine 0.9, potassium 4.8, ALT 54, CMP otherwise normal, CBC normal Hyperlipidemia, group A (E78.00) Dyspnea on exertion (R06.09) Story: Echocardiogram 04/06/2016: Left ventricle cavity is normal in size. Normal global wall motion. Low normal LV systolic function. Normal diastolic filling pattern. Visual EF is 50-55%. Mild tricuspid regurgitation. No evidence of pulmonary hypertension. Pulmonary artery systolic pressure is estimated at 24 mm Hg. Mild pulmonic regurgitation. IVC is normal with poor inspiration collapse consistent with elevated right atrial pressure.  Current Plans Mechanism of underlying disease process and action of medications discussed with the patient. I discussed primary/secondary prevention and also dietary counseling was done. He presents for follow-up of chest pain after increasing statin and adding beta blocker. He reports a significant improvement in symptoms, however does continue to report intermittent episodes of chest pain. Symptoms initially atypical in presentation, however, on further questioning, he never experiences  symptoms unless he is in a stressful situation or is exerting himself. Patient actively having chest pain during office visit and EKG was performed with no acute findings. However, given abnormal stress test with evidence of ischemia in the LAD territory, again discuss coronary angiogram for definitive evaluation of coronary anatomy given ongoing symptoms. Patient again does not wish to proceed with coronary angiogram and will continue medical therapy for now. He was given sublingual nitroglycerin to use as needed for recurrent chest pain not relieved by rest and instructed on use. He was advised to call us immediately if he is having more than 2 episodes per week. Otherwise, we'll see him back  in one month for reevaluation and follow-up.  *I have discussed this case with Dr. Jacinto Halim and he personally examined the patient and participated in formulating the plan.*  Addendum Note(Miguel Revonda Standard Miguel Thornton; 05/17/2016 4:31 PM) Patient called the office reporting worsening symptoms of chest pain despite medical therapy, states he now wishes to proceed with coronary angiogram and possible angioplasty. We discussed regarding risks, benefits, alternatives to this including CTA and continued medical therapy. Patient wants to proceed. Understands <1-2% risk of death, stroke, MI, urgent CABG, bleeding, infection, renal failure but not limited to these.  05/16/2016: CBC normal, glucose 163, creatinine 0.83, PT/INR normal  Labs stable to proceed.  Signed by Miguel Thornton, Miguel Thornton (04/28/2016 12:29 PM)

## 2016-05-23 ENCOUNTER — Encounter (HOSPITAL_COMMUNITY)
Admission: RE | Disposition: A | Discharge status: Home / Self Care | Payer: Self-pay | Source: Ambulatory Visit | Attending: Cardiology

## 2016-05-23 ENCOUNTER — Encounter (HOSPITAL_COMMUNITY): Payer: Self-pay | Admitting: General Practice

## 2016-05-23 ENCOUNTER — Ambulatory Visit (HOSPITAL_COMMUNITY)
Admission: RE | Admit: 2016-05-23 | Discharge: 2016-05-24 | Disposition: A | Discharge status: Home / Self Care | Payer: BLUE CROSS/BLUE SHIELD | Source: Ambulatory Visit | Attending: Cardiology | Admitting: Cardiology

## 2016-05-23 DIAGNOSIS — Z7982 Long term (current) use of aspirin: Secondary | ICD-10-CM | POA: Diagnosis not present

## 2016-05-23 DIAGNOSIS — Z87891 Personal history of nicotine dependence: Secondary | ICD-10-CM | POA: Diagnosis not present

## 2016-05-23 DIAGNOSIS — E1165 Type 2 diabetes mellitus with hyperglycemia: Secondary | ICD-10-CM | POA: Insufficient documentation

## 2016-05-23 DIAGNOSIS — Z9861 Coronary angioplasty status: Secondary | ICD-10-CM

## 2016-05-23 DIAGNOSIS — I10 Essential (primary) hypertension: Secondary | ICD-10-CM | POA: Insufficient documentation

## 2016-05-23 DIAGNOSIS — Z7984 Long term (current) use of oral hypoglycemic drugs: Secondary | ICD-10-CM | POA: Diagnosis not present

## 2016-05-23 DIAGNOSIS — I25119 Atherosclerotic heart disease of native coronary artery with unspecified angina pectoris: Secondary | ICD-10-CM | POA: Insufficient documentation

## 2016-05-23 DIAGNOSIS — E785 Hyperlipidemia, unspecified: Secondary | ICD-10-CM | POA: Diagnosis not present

## 2016-05-23 DIAGNOSIS — Z955 Presence of coronary angioplasty implant and graft: Secondary | ICD-10-CM

## 2016-05-23 DIAGNOSIS — Z79899 Other long term (current) drug therapy: Secondary | ICD-10-CM | POA: Insufficient documentation

## 2016-05-23 HISTORY — DX: Acute myocardial infarction, unspecified: I21.9

## 2016-05-23 HISTORY — PX: CORONARY ANGIOPLASTY WITH STENT PLACEMENT: SHX49

## 2016-05-23 HISTORY — PX: CARDIAC CATHETERIZATION: SHX172

## 2016-05-23 HISTORY — DX: Atherosclerotic heart disease of native coronary artery without angina pectoris: I25.10

## 2016-05-23 HISTORY — DX: Type 2 diabetes mellitus without complications: E11.9

## 2016-05-23 LAB — GLUCOSE, CAPILLARY
GLUCOSE-CAPILLARY: 147 mg/dL — AB (ref 65–99)
GLUCOSE-CAPILLARY: 163 mg/dL — AB (ref 65–99)
Glucose-Capillary: 121 mg/dL — ABNORMAL HIGH (ref 65–99)
Glucose-Capillary: 138 mg/dL — ABNORMAL HIGH (ref 65–99)
Glucose-Capillary: 143 mg/dL — ABNORMAL HIGH (ref 65–99)

## 2016-05-23 LAB — POCT ACTIVATED CLOTTING TIME
ACTIVATED CLOTTING TIME: 230 s
ACTIVATED CLOTTING TIME: 257 s
Activated Clotting Time: 274 seconds
Activated Clotting Time: 296 seconds

## 2016-05-23 SURGERY — LEFT HEART CATH AND CORONARY ANGIOGRAPHY

## 2016-05-23 MED ORDER — ASPIRIN 81 MG PO CHEW
81.0000 mg | CHEWABLE_TABLET | Freq: Every day | ORAL | Status: DC
  Administered 2016-05-24: 08:00:00 81 mg via ORAL
  Filled 2016-05-23: qty 1

## 2016-05-23 MED ORDER — CARVEDILOL 3.125 MG PO TABS
6.2500 mg | ORAL_TABLET | Freq: Two times a day (BID) | ORAL | Status: DC
  Administered 2016-05-23 – 2016-05-24 (×2): 6.25 mg via ORAL
  Filled 2016-05-23 (×2): qty 2

## 2016-05-23 MED ORDER — IOPAMIDOL (ISOVUE-370) INJECTION 76%
INTRAVENOUS | Status: AC
  Filled 2016-05-23: qty 100

## 2016-05-23 MED ORDER — HYDROMORPHONE HCL 1 MG/ML IJ SOLN
INTRAMUSCULAR | Status: AC
  Filled 2016-05-23: qty 1

## 2016-05-23 MED ORDER — ASPIRIN 81 MG PO CHEW: 81.0000 mg | CHEWABLE_TABLET | ORAL | Status: DC

## 2016-05-23 MED ORDER — HYDROMORPHONE HCL 1 MG/ML IJ SOLN
INTRAMUSCULAR | Status: DC | PRN
  Administered 2016-05-23 (×4): 0.5 mg via INTRAVENOUS

## 2016-05-23 MED ORDER — HEPARIN SODIUM (PORCINE) 1000 UNIT/ML IJ SOLN
INTRAMUSCULAR | Status: AC
  Filled 2016-05-23: qty 1

## 2016-05-23 MED ORDER — LIDOCAINE HCL (PF) 1 % IJ SOLN
INTRAMUSCULAR | Status: AC
  Filled 2016-05-23: qty 30

## 2016-05-23 MED ORDER — VERAPAMIL HCL 2.5 MG/ML IV SOLN
INTRAVENOUS | Status: AC
  Filled 2016-05-23: qty 2

## 2016-05-23 MED ORDER — HEPARIN SODIUM (PORCINE) 1000 UNIT/ML IJ SOLN
INTRAMUSCULAR | Status: DC | PRN
  Administered 2016-05-23: 4000 [IU] via INTRAVENOUS
  Administered 2016-05-23: 3000 [IU] via INTRAVENOUS
  Administered 2016-05-23: 6000 [IU] via INTRAVENOUS

## 2016-05-23 MED ORDER — MIDAZOLAM HCL 2 MG/2ML IJ SOLN
INTRAMUSCULAR | Status: AC
  Filled 2016-05-23: qty 2

## 2016-05-23 MED ORDER — NITROGLYCERIN 1 MG/10 ML FOR IR/CATH LAB
INTRA_ARTERIAL | Status: AC
  Filled 2016-05-23: qty 10

## 2016-05-23 MED ORDER — ACETAMINOPHEN 325 MG PO TABS: 650.0000 mg | ORAL_TABLET | ORAL | Status: DC | PRN

## 2016-05-23 MED ORDER — SODIUM CHLORIDE 0.9 % WEIGHT BASED INFUSION
1.0000 mL/kg/h | INTRAVENOUS | Status: DC
  Administered 2016-05-23: 250 mL via INTRAVENOUS

## 2016-05-23 MED ORDER — LIDOCAINE HCL (PF) 1 % IJ SOLN
INTRAMUSCULAR | Status: DC | PRN
  Administered 2016-05-23: 2 mL via INTRADERMAL

## 2016-05-23 MED ORDER — ONDANSETRON HCL 4 MG/2ML IJ SOLN: 4.0000 mg | Freq: Four times a day (QID) | INTRAMUSCULAR | Status: DC | PRN

## 2016-05-23 MED ORDER — NITROGLYCERIN 1 MG/10 ML FOR IR/CATH LAB
INTRA_ARTERIAL | Status: DC | PRN
  Administered 2016-05-23 (×2): 200 ug via INTRACORONARY

## 2016-05-23 MED ORDER — SODIUM CHLORIDE 0.9% FLUSH: 3.0000 mL | Freq: Two times a day (BID) | INTRAVENOUS | Status: DC

## 2016-05-23 MED ORDER — INSULIN ASPART 100 UNIT/ML ~~LOC~~ SOLN
0.0000 [IU] | Freq: Three times a day (TID) | SUBCUTANEOUS | Status: DC
  Administered 2016-05-24: 2 [IU] via SUBCUTANEOUS

## 2016-05-23 MED ORDER — TICAGRELOR 90 MG PO TABS
ORAL_TABLET | ORAL | Status: DC | PRN
  Administered 2016-05-23: 180 mg via ORAL

## 2016-05-23 MED ORDER — HEPARIN (PORCINE) IN NACL 2-0.9 UNIT/ML-% IJ SOLN
INTRAMUSCULAR | Status: AC
  Filled 2016-05-23: qty 1000

## 2016-05-23 MED ORDER — SODIUM CHLORIDE 0.9% FLUSH: 3.0000 mL | INTRAVENOUS | Status: DC | PRN

## 2016-05-23 MED ORDER — MIDAZOLAM HCL 2 MG/2ML IJ SOLN
INTRAMUSCULAR | Status: DC | PRN
  Administered 2016-05-23 (×2): 1 mg via INTRAVENOUS
  Administered 2016-05-23: 2 mg via INTRAVENOUS

## 2016-05-23 MED ORDER — ZOLPIDEM TARTRATE 5 MG PO TABS
5.0000 mg | ORAL_TABLET | Freq: Every evening | ORAL | Status: AC | PRN
  Administered 2016-05-23: 21:00:00 5 mg via ORAL
  Filled 2016-05-23: qty 1

## 2016-05-23 MED ORDER — HEPARIN (PORCINE) IN NACL 2-0.9 UNIT/ML-% IJ SOLN
INTRAMUSCULAR | Status: DC | PRN
  Administered 2016-05-23: 1000 mL

## 2016-05-23 MED ORDER — SODIUM CHLORIDE 0.9 % IV SOLN: 250.0000 mL | INTRAVENOUS | Status: DC | PRN

## 2016-05-23 MED ORDER — ATORVASTATIN CALCIUM 40 MG PO TABS
40.0000 mg | ORAL_TABLET | Freq: Every evening | ORAL | Status: DC
  Administered 2016-05-23: 40 mg via ORAL
  Filled 2016-05-23: qty 1

## 2016-05-23 MED ORDER — ALPRAZOLAM 0.5 MG PO TABS
0.5000 mg | ORAL_TABLET | ORAL | Status: DC | PRN
  Administered 2016-05-23: 21:00:00 0.5 mg via ORAL
  Filled 2016-05-23: qty 1

## 2016-05-23 MED ORDER — VERAPAMIL HCL 2.5 MG/ML IV SOLN
INTRAVENOUS | Status: DC | PRN
  Administered 2016-05-23: 300 ug via INTRA_ARTERIAL
  Administered 2016-05-23: 300 ug via INTRACORONARY
  Administered 2016-05-23: 400 ug via INTRACORONARY

## 2016-05-23 MED ORDER — SODIUM CHLORIDE 0.9 % WEIGHT BASED INFUSION
3.0000 mL/kg/h | INTRAVENOUS | Status: AC
  Administered 2016-05-23: 3 mL/kg/h via INTRAVENOUS

## 2016-05-23 MED ORDER — SODIUM CHLORIDE 0.9 % WEIGHT BASED INFUSION
3.0000 mL/kg/h | INTRAVENOUS | Status: DC
  Administered 2016-05-23: 3 mL/kg/h via INTRAVENOUS

## 2016-05-23 MED ORDER — SODIUM CHLORIDE 0.9% FLUSH
3.0000 mL | Freq: Two times a day (BID) | INTRAVENOUS | Status: DC
  Administered 2016-05-23: 3 mL via INTRAVENOUS

## 2016-05-23 MED ORDER — NITROGLYCERIN 0.4 MG SL SUBL
SUBLINGUAL_TABLET | SUBLINGUAL | Status: AC
  Administered 2016-05-23: 0.4 mg
  Filled 2016-05-23: qty 1

## 2016-05-23 MED ORDER — IOPAMIDOL (ISOVUE-370) INJECTION 76%
INTRAVENOUS | Status: DC | PRN
  Administered 2016-05-23: 270 mL via INTRA_ARTERIAL

## 2016-05-23 MED ORDER — VERAPAMIL HCL 2.5 MG/ML IV SOLN
INTRA_ARTERIAL | Status: DC | PRN
  Administered 2016-05-23 (×2): 5 mL via INTRA_ARTERIAL

## 2016-05-23 MED ORDER — TICAGRELOR 90 MG PO TABS
90.0000 mg | ORAL_TABLET | Freq: Two times a day (BID) | ORAL | Status: DC
  Administered 2016-05-23 – 2016-05-24 (×2): 90 mg via ORAL
  Filled 2016-05-23 (×2): qty 1

## 2016-05-23 MED ORDER — TICAGRELOR 90 MG PO TABS
ORAL_TABLET | ORAL | Status: AC
  Filled 2016-05-23: qty 2

## 2016-05-23 SURGICAL SUPPLY — 21 items
BALLN EMERGE MR 2.0X30 (BALLOONS) ×3
BALLN EMERGE MR PUSH 1.5X12 (BALLOONS) ×3
BALLN ~~LOC~~ MOZEC 3.0X28 (BALLOONS) ×3
CATH HEARTRAIL 6F IL3.5 (CATHETERS) ×3
CATH MICRO ASAHI CORSAIR 150CM (MICROCATHETER) ×3
CATH OPTITORQUE TIG 4.0 5F (CATHETERS) ×3
CATH VISTA GUIDE 6FR XB3.5 (CATHETERS) ×3
GLIDESHEATH SLEND A-KIT 6F 20G (SHEATH) ×3
INQWIRE 1.5J .035X260CM (WIRE) ×3
KIT ENCORE 26 ADVANTAGE (KITS) ×3
KIT HEART LEFT (KITS) ×3
PACK CARDIAC CATHETERIZATION (CUSTOM PROCEDURE TRAY) ×3
STENT PROMUS PREM MR 2.75X38 (Permanent Stent) ×3 IMPLANT
STENT PROMUS PREM MR 3.0X38 (Permanent Stent) ×3 IMPLANT
TORQUE DEVICE .014-.018 (MISCELLANEOUS) ×3
TRANSDUCER W/STOPCOCK (MISCELLANEOUS) ×3
TUBING CIL FLEX 10 FLL-RA (TUBING) ×3
WIRE ASAHI MIRACLEBROS-6 180CM (WIRE) ×3
WIRE HI TORQ BMW 300CM (WIRE) ×3
WIRE HI TORQ VERSACORE-J 145CM (WIRE) ×3
WIRE MAILMAN 300CM (WIRE) ×3

## 2016-05-23 NOTE — Progress Notes (Signed)
C/o cp 6/10. Onset approx 1730; quality is dull and achy, non-reproducible with palpation. EKG done, 1 SL nitro given- pain improved. Refuses another nitro due to headache. Dr. Nadara Eaton paged.  Orders received. Will continue to monitor pt.

## 2016-05-23 NOTE — Interval H&P Note (Signed)
History and Physical Interval Note:  05/23/2016 7:45 AM  Miguel Thornton  has presented today for surgery, with the diagnosis of angina  The various methods of treatment have been discussed with the patient and family. After consideration of risks, benefits and other options for treatment, the patient has consented to  Procedure(s): Left Heart Cath and Coronary Angiography (N/A) as a surgical intervention .  The patient's history has been reviewed, patient examined, no change in status, stable for surgery.  I have reviewed the patient's chart and labs.  Questions were answered to the patient's satisfaction.    Ischemic Symptoms? CCS II (Slight limitation of ordinary activity) Anti-ischemic Medical Therapy? Minimal Therapy (1 class of medications) Non-invasive Test Results? High-risk stress test findings: cardiac mortality >3%/yr Prior CABG? No Previous CABG   Patient Information:   1-2V CAD, no prox LAD  A (7)  Indication: 18; Score: 7   Patient Information:   CTO of 1 vessel, no other CAD  U (5)  Indication: 28; Score: 5   Patient Information:   1V CAD with prox LAD  A (8)  Indication: 34; Score: 8   Patient Information:   2V-CAD with prox LAD  A (8)  Indication: 40; Score: 8   Patient Information:   3V-CAD without LMCA  A (8)  Indication: 46; Score: 8   Patient Information:   3V-CAD without LMCA With Abnormal LV systolic function  A (9)  Indication: 48; Score: 9   Patient Information:   LMCA-CAD  A (9)  Indication: 49; Score: 9   Patient Information:   2V-CAD with prox LAD PCI  A (7)  Indication: 62; Score: 7   Patient Information:   2V-CAD with prox LAD CABG  A (8)  Indication: 62; Score: 8   Patient Information:   3V-CAD without LMCA With Low CAD burden(i.e., 3 focal stenoses, low SYNTAX score) PCI  A (7)  Indication: 63; Score: 7   Patient Information:   3V-CAD without LMCA With Low CAD burden(i.e., 3 focal stenoses, low  SYNTAX score) CABG  A (9)  Indication: 63; Score: 9   Patient Information:   3V-CAD without LMCA E06c - Intermediate-high CAD burden (i.e., multiple diffuse lesions, presence of CTO, or high SYNTAX score) PCI  U (4)  Indication: 64; Score: 4   Patient Information:   3V-CAD without LMCA E06c - Intermediate-high CAD burden (i.e., multiple diffuse lesions, presence of CTO, or high SYNTAX score) CABG  A (9)  Indication: 64; Score: 9   Patient Information:   LMCA-CAD With Isolated LMCA stenosis  PCI  U (6)  Indication: 65; Score: 6   Patient Information:   LMCA-CAD With Isolated LMCA stenosis  CABG  A (9)  Indication: 65; Score: 9   Patient Information:   LMCA-CAD Additional CAD, low CAD burden (i.e., 1- to 2-vessel additional involvement, low SYNTAX score) PCI  U (5)  Indication: 66; Score: 5   Patient Information:   LMCA-CAD Additional CAD, low CAD burden (i.e., 1- to 2-vessel additional involvement, low SYNTAX score) CABG  A (9)  Indication: 66; Score: 9   Patient Information:   LMCA-CAD Additional CAD, intermediate-high CAD burden (i.e., 3-vessel involvement, presence of CTO, or high SYNTAX score) PCI  I (3)  Indication: 67; Score: 3   Patient Information:   LMCA-CAD Additional CAD, intermediate-high CAD burden (i.e., 3-vessel involvement, presence of CTO, or high SYNTAX score) CABG  A (9)  Indication: 67; Score: 9  Miguel Thornton

## 2016-05-23 NOTE — Discharge Instructions (Signed)
Your procedure required the use of prolonged amounts of x-ray.  Radiation side-effects are unlikely but possible.  Please have a family member inspect your chest and back area daily, for signs of redness or rash two weeks from today.  Please call your provider and tell us whether if you have concerns about your findings. °

## 2016-05-23 NOTE — Care Management Note (Signed)
Case Management Note  Patient Details  Name: Miguel Thornton MRN: [id number] Date of Birth: Jul 27, 1970  Subjective/Objective:    S/p Coronary/Bypass Graft CTO Intervention, will be on brilinta. NCM awaiting benefit check for brilinata.                Action/Plan:   Expected Discharge Date:                  Expected Discharge Plan:  Home/Self Care  In-House Referral:     Discharge planning Services  CM Consult  Post Acute Care Choice:    Choice offered to:     DME Arranged:    DME Agency:     HH Arranged:    HH Agency:     Status of Service:  In process, will continue to follow  If discussed at Long Length of Stay Meetings, dates discussed:    Additional Comments:  Leone Haven, RN 05/23/2016, 2:12 PM

## 2016-05-23 NOTE — Progress Notes (Signed)
BRILINTA 90 MG BID   COVER- YES  CO-PAY- $ 80.00  TIER- 4 DRUG  PRIOR APPROVAL- YES # [phone]  PHARMACY : [facility] AND [facility]

## 2016-05-24 ENCOUNTER — Encounter (HOSPITAL_COMMUNITY): Payer: Self-pay | Admitting: Cardiology

## 2016-05-24 DIAGNOSIS — I10 Essential (primary) hypertension: Secondary | ICD-10-CM | POA: Diagnosis not present

## 2016-05-24 DIAGNOSIS — E785 Hyperlipidemia, unspecified: Secondary | ICD-10-CM | POA: Diagnosis not present

## 2016-05-24 DIAGNOSIS — E1165 Type 2 diabetes mellitus with hyperglycemia: Secondary | ICD-10-CM | POA: Diagnosis not present

## 2016-05-24 DIAGNOSIS — I25119 Atherosclerotic heart disease of native coronary artery with unspecified angina pectoris: Secondary | ICD-10-CM | POA: Diagnosis not present

## 2016-05-24 LAB — BASIC METABOLIC PANEL
Anion gap: 8 (ref 5–15)
BUN: 8 mg/dL (ref 6–20)
CHLORIDE: 104 mmol/L (ref 101–111)
CO2: 24 mmol/L (ref 22–32)
CREATININE: 0.78 mg/dL (ref 0.61–1.24)
Calcium: 8.9 mg/dL (ref 8.9–10.3)
GFR calc Af Amer: >60 mL/min (ref >60)
GFR calc non Af Amer: >60 mL/min (ref >60)
GLUCOSE: 143 mg/dL — AB (ref 65–99)
Potassium: 3.8 mmol/L (ref 3.5–5.1)
Sodium: 136 mmol/L (ref 135–145)

## 2016-05-24 LAB — GLUCOSE, CAPILLARY: GLUCOSE-CAPILLARY: 161 mg/dL — AB (ref 65–99)

## 2016-05-24 LAB — CBC
HCT: 42.9 % (ref 39.0–52.0)
Hemoglobin: 14.4 g/dL (ref 13.0–17.0)
MCH: 29.9 pg (ref 26.0–34.0)
MCHC: 33.6 g/dL (ref 30.0–36.0)
MCV: 89.2 fL (ref 78.0–100.0)
PLATELETS: 193 10*3/uL (ref 150–400)
RBC: 4.81 MIL/uL (ref 4.22–5.81)
RDW: 12.2 % (ref 11.5–15.5)
WBC: 8.1 10*3/uL (ref 4.0–10.5)

## 2016-05-24 MED ORDER — TICAGRELOR 90 MG PO TABS: 90.0000 mg | ORAL_TABLET | Freq: Two times a day (BID) | ORAL | 0 refills | Status: AC

## 2016-05-24 MED ORDER — ANGIOPLASTY BOOK
Freq: Once | Status: AC
  Administered 2016-05-24: 03:00:00
  Filled 2016-05-24: qty 1

## 2016-05-24 NOTE — Progress Notes (Signed)
CARDIAC REHAB PHASE I   PRE:  Rate/Rhythm: 83 SR  BP:  Sitting: 141/93        SaO2: 99 RA  MODE:  Ambulation: 600 ft   POST:  Rate/Rhythm: 80 SR  BP:  Sitting: 142/85         SaO2: 99 RA  Pt ambulated 600 ft on RA, handheld assist, steady gait, tolerated well with no complaints. Completed PCI/stent education with pt and pt's mother at bedside.  Reviewed risk factors, stent book, anti-platelet therapy, stent card, activity restrictions, ntg, exercise, heart healthy diet, carb counting, portion control, and phase 2 cardiac rehab. Pt verbalized understanding, receptive to education. Pt agrees to phase 2 cardiac rehab referral, will send to [facility] per pt request. Pt to bed per pt request after walk, call bell within reach.   [id number] Joylene Grapes, RN, BSN 05/24/2016 10:09 AM

## 2016-05-24 NOTE — Discharge Summary (Signed)
Physician Discharge Summary  Patient ID: Miguel Thornton MRN: [id number] DOB/AGE: 1971-06-13 45 y.o.  Admit date: 05/23/2016 Discharge date: 05/24/2016  Discharge Diagnosis 1.  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  S/P PTCA stenting proximal and mid to distal LAD with 2 overlapping 3.0 x 38 and 2.75 x 38 mm Promus Premier DES on 05/23/2016, mild disease in other vessels.  Normal LVEF. 3.  Hypertension 4.  Hyperlipidemia 5.  Diabetes mellitus type 2 uncontrolled without incident use, new onset 6.  Dyspnea on exertion  Significant Diagnostic Studies: Coronary angiogram 05/23/2016: 1. Normal LV systolic function, EF 55-60%. No wall motion of normality. 2. Left main mildly calcified. LAD proximal 80% stenosis followed by occlusion in the midsegment. Very small diffusely diseased D1 and small to moderate-sized D2 with a mid 80% stenosis. LAD fills faintly by ipsilateral and contralateral collaterals. 3. Moderate sized circumflex with small marginals. No significant disease. 4. Large RCA with moderate size PL and PDA, no significant disease. Gives faint collaterals to the LAD.  Interventional data: Successful PTCA and stenting of CTO of the proximal to mid to distal segment of the LAD with implantation of a overlapping 3.0 x 38 mm and 0.75 x 38 mm Promus premier DES.  Will need Dual antiplatelet therapy with Brilinta and ASA 81 mg for at least one year or longer due to complex disease.   Hospital Course: The patient is a 45 year old male who presents for a Follow-up for Chest pain. He was recently diagnosed on 02/22/2016 with diabetes. He presented for evaluation due to fatigue, malaise, and atypical chest pain. HbA1c at that time was 10.4%. Nuclear stress test on 04/07/2016 had revealed severe ischemia and anteroseptal and apical septum, EF 39%.  Echocardiogram on 04/06/2016 revealed EF 55%.  Due to class III symptoms of angina pectoris in spite of medical therapy, he was  brought to the cardiac catheterization lab and underwent complex but successful angioplasty to diffusely diseased and chronic total occlusion of the LAD with implantation of 2 overlapping drug-eluting stents.  The following morning patient had no further chest pains, stated that he walked in the hallway and actually noticed a difference of how he felt.  He was felt stable for discharge.  Recommendations on discharge: She'll be continued on aspirin along with Brilinta at least one year and aspirin indefinitely.  Discharge Exam: Blood pressure 128/77, pulse 84, temperature 98 F (36.7 C), temperature source Oral, resp. rate (!) 22, height 5\' 7"  (1.702 m), weight 82.6 kg (182 lb 1.6 oz), SpO2 99 %.  Body mass index is 28.52 kg/m.  General appearance: alert, cooperative, appears stated age and no distress Resp: clear to auscultation bilaterally Chest wall: no tenderness Cardio: regular rate and rhythm, S1, S2 normal, no murmur, click, rub or gallop GI: soft, non-tender; bowel sounds normal; no masses,  no organomegaly Extremities: extremities normal, atraumatic, no cyanosis or edema Pulses: 2+ and symmetric Right radiac access without complications Neurologic: Grossly normal Labs:   Lab Results  Component Value Date   WBC 8.1 05/24/2016   HGB 14.4 05/24/2016   HCT 42.9 05/24/2016   MCV 89.2 05/24/2016   PLT 193 05/24/2016    Recent Labs Lab 05/24/16 0412  NA 136  K 3.8  CL 104  CO2 24  BUN 8  CREATININE 0.78  CALCIUM 8.9  GLUCOSE 143*    EKG: normal EKG, normal sinus rhythm, unchanged from previous tracings.  FOLLOW UP PLANS AND APPOINTMENTS  Medication List    STOP taking these medications   ibuprofen 200 MG tablet Commonly known as:  ADVIL,MOTRIN   tetrahydrozoline 0.05 % ophthalmic solution     TAKE these medications   aspirin 81 MG chewable tablet Chew 81 mg by mouth daily.   atorvastatin 40 MG tablet Commonly known as:  LIPITOR Take 40 mg by mouth  every evening.   carvedilol 6.25 MG tablet Commonly known as:  COREG Take 6.25 mg by mouth 2 (two) times daily.   metFORMIN 500 MG tablet Commonly known as:  GLUCOPHAGE Take 500 mg by mouth 2 (two) times daily.   nitroGLYCERIN 0.4 MG SL tablet Commonly known as:  NITROSTAT Place 0.4 mg under the tongue every 5 (five) minutes as needed for chest pain.   ticagrelor 90 MG Tabs tablet Commonly known as:  BRILINTA Take 1 tablet (90 mg total) by mouth 2 (two) times daily.      Follow-up Information    Yates DecampGANJI, Ryver Zadrozny, MD Follow up.   Specialty:  Cardiology Why:  Keep previous appointment Contact information: [address] [place] [address] [phone]            Yates DecampGANJI, Lemya Greenwell, MD 05/24/2016, 9:16 AM  Pager: [phone] Office: [phone] If no answer: [phone]

## 2016-05-24 NOTE — Care Management Note (Signed)
Case Management Note  Patient Details  Name: Miguel Thornton MRN: [id number] Date of Birth: [birth year]  Subjective/Objective:   S/p coronary /bypass graft cto intervention, NCM gave patient 30 day savings card for brilinta and co pay of $80,  pta indep.  Patient is for dc today.  No other needs.                 Action/Plan:   Expected Discharge Date:                  Expected Discharge Plan:  Home/Self Care  In-House Referral:     Discharge planning Services  CM Consult  Post Acute Care Choice:    Choice offered to:     DME Arranged:    DME Agency:     HH Arranged:    HH Agency:     Status of Service:  Completed, signed off  If discussed at [employer] of Stay Meetings, dates discussed:    Additional Comments:  Leone Haven, RN 05/24/2016, 10:05 AM

## 2016-06-02 ENCOUNTER — Telehealth (HOSPITAL_COMMUNITY): Payer: Self-pay | Admitting: Cardiac Rehabilitation

## 2016-06-02 NOTE — Telephone Encounter (Signed)
pc received from pt he is not interested in cardiac rehab at this time due to work conflict.

## 2016-11-11 ENCOUNTER — Ambulatory Visit (HOSPITAL_COMMUNITY): Admission: EM | Admit: 2016-11-11 | Discharge: 2016-11-11 | Payer: BLUE CROSS/BLUE SHIELD | Source: Home / Self Care

## 2016-11-11 ENCOUNTER — Emergency Department (HOSPITAL_COMMUNITY): Payer: BLUE CROSS/BLUE SHIELD

## 2016-11-11 ENCOUNTER — Encounter (HOSPITAL_COMMUNITY): Payer: Self-pay | Admitting: Emergency Medicine

## 2016-11-11 ENCOUNTER — Observation Stay (HOSPITAL_COMMUNITY)
Admission: EM | Admit: 2016-11-11 | Discharge: 2016-11-12 | Disposition: A | Discharge status: Home / Self Care | Payer: BLUE CROSS/BLUE SHIELD | Attending: Family Medicine | Admitting: Family Medicine

## 2016-11-11 DIAGNOSIS — I25119 Atherosclerotic heart disease of native coronary artery with unspecified angina pectoris: Secondary | ICD-10-CM | POA: Diagnosis not present

## 2016-11-11 DIAGNOSIS — E871 Hypo-osmolality and hyponatremia: Secondary | ICD-10-CM | POA: Insufficient documentation

## 2016-11-11 DIAGNOSIS — Z23 Encounter for immunization: Secondary | ICD-10-CM | POA: Diagnosis not present

## 2016-11-11 DIAGNOSIS — A419 Sepsis, unspecified organism: Secondary | ICD-10-CM | POA: Diagnosis not present

## 2016-11-11 DIAGNOSIS — R0789 Other chest pain: Secondary | ICD-10-CM | POA: Insufficient documentation

## 2016-11-11 DIAGNOSIS — I252 Old myocardial infarction: Secondary | ICD-10-CM | POA: Diagnosis not present

## 2016-11-11 DIAGNOSIS — Z87891 Personal history of nicotine dependence: Secondary | ICD-10-CM | POA: Insufficient documentation

## 2016-11-11 DIAGNOSIS — E785 Hyperlipidemia, unspecified: Secondary | ICD-10-CM | POA: Diagnosis not present

## 2016-11-11 DIAGNOSIS — I10 Essential (primary) hypertension: Secondary | ICD-10-CM | POA: Diagnosis not present

## 2016-11-11 DIAGNOSIS — Z955 Presence of coronary angioplasty implant and graft: Secondary | ICD-10-CM | POA: Diagnosis not present

## 2016-11-11 DIAGNOSIS — E1165 Type 2 diabetes mellitus with hyperglycemia: Secondary | ICD-10-CM | POA: Insufficient documentation

## 2016-11-11 DIAGNOSIS — E119 Type 2 diabetes mellitus without complications: Secondary | ICD-10-CM | POA: Diagnosis not present

## 2016-11-11 DIAGNOSIS — Z7982 Long term (current) use of aspirin: Secondary | ICD-10-CM | POA: Diagnosis not present

## 2016-11-11 DIAGNOSIS — N390 Urinary tract infection, site not specified: Secondary | ICD-10-CM | POA: Diagnosis not present

## 2016-11-11 DIAGNOSIS — R079 Chest pain, unspecified: Secondary | ICD-10-CM | POA: Diagnosis present

## 2016-11-11 DIAGNOSIS — R319 Hematuria, unspecified: Secondary | ICD-10-CM | POA: Diagnosis not present

## 2016-11-11 DIAGNOSIS — E861 Hypovolemia: Secondary | ICD-10-CM | POA: Insufficient documentation

## 2016-11-11 DIAGNOSIS — N179 Acute kidney failure, unspecified: Secondary | ICD-10-CM | POA: Diagnosis present

## 2016-11-11 DIAGNOSIS — I959 Hypotension, unspecified: Secondary | ICD-10-CM | POA: Insufficient documentation

## 2016-11-11 LAB — CBC
HCT: 45.3 % (ref 39.0–52.0)
Hemoglobin: 16.1 g/dL (ref 13.0–17.0)
MCH: 30.7 pg (ref 26.0–34.0)
MCHC: 35.5 g/dL (ref 30.0–36.0)
MCV: 86.3 fL (ref 78.0–100.0)
Platelets: 250 10*3/uL (ref 150–400)
RBC: 5.25 MIL/uL (ref 4.22–5.81)
RDW: 11.7 % (ref 11.5–15.5)
WBC: 12.8 10*3/uL — AB (ref 4.0–10.5)

## 2016-11-11 LAB — URINALYSIS, ROUTINE W REFLEX MICROSCOPIC
Bilirubin Urine: NEGATIVE
GLUCOSE, UA: NEGATIVE mg/dL
Ketones, ur: 20 mg/dL — AB
Nitrite: NEGATIVE
PH: 5 (ref 5.0–8.0)
Protein, ur: 100 mg/dL — AB
SPECIFIC GRAVITY, URINE: 1.03 (ref 1.005–1.030)

## 2016-11-11 LAB — BASIC METABOLIC PANEL
Anion gap: 15 (ref 5–15)
BUN: 24 mg/dL — ABNORMAL HIGH (ref 6–20)
CO2: 19 mmol/L — AB (ref 22–32)
CREATININE: 1.77 mg/dL — AB (ref 0.61–1.24)
Calcium: 10 mg/dL (ref 8.9–10.3)
Chloride: 95 mmol/L — ABNORMAL LOW (ref 101–111)
GFR calc non Af Amer: 44 mL/min — ABNORMAL LOW (ref >60)
GFR, EST AFRICAN AMERICAN: 51 mL/min — AB (ref >60)
Glucose, Bld: 258 mg/dL — ABNORMAL HIGH (ref 65–99)
Potassium: 5.1 mmol/L (ref 3.5–5.1)
SODIUM: 129 mmol/L — AB (ref 135–145)

## 2016-11-11 LAB — PROTIME-INR
INR: 1.07
PROTHROMBIN TIME: 14 s (ref 11.4–15.2)

## 2016-11-11 LAB — LACTIC ACID, PLASMA: Lactic Acid, Venous: 0.9 mmol/L (ref 0.5–1.9)

## 2016-11-11 LAB — I-STAT TROPONIN, ED: Troponin i, poc: 0.01 ng/mL (ref 0.00–0.08)

## 2016-11-11 MED ORDER — ACETAMINOPHEN 650 MG RE SUPP: 650.0000 mg | Freq: Four times a day (QID) | RECTAL | Status: DC | PRN

## 2016-11-11 MED ORDER — ACETAMINOPHEN 325 MG PO TABS: 650.0000 mg | ORAL_TABLET | Freq: Four times a day (QID) | ORAL | Status: DC | PRN

## 2016-11-11 MED ORDER — ATORVASTATIN CALCIUM 40 MG PO TABS
40.0000 mg | ORAL_TABLET | Freq: Every day | ORAL | Status: DC
  Administered 2016-11-12: 40 mg via ORAL
  Filled 2016-11-11: qty 1

## 2016-11-11 MED ORDER — SODIUM CHLORIDE 0.9 % IV BOLUS (SEPSIS)
1000.0000 mL | Freq: Once | INTRAVENOUS | Status: AC
  Administered 2016-11-11: 1000 mL via INTRAVENOUS

## 2016-11-11 MED ORDER — SODIUM CHLORIDE 0.9% FLUSH
3.0000 mL | Freq: Two times a day (BID) | INTRAVENOUS | Status: DC
  Administered 2016-11-11 – 2016-11-12 (×2): 3 mL via INTRAVENOUS

## 2016-11-11 MED ORDER — DEXTROSE 5 % IV SOLN
1.0000 g | Freq: Once | INTRAVENOUS | Status: DC
  Administered 2016-11-11: 1 g via INTRAVENOUS
  Filled 2016-11-11: qty 10

## 2016-11-11 MED ORDER — ASPIRIN 81 MG PO CHEW
324.0000 mg | CHEWABLE_TABLET | Freq: Once | ORAL | Status: AC
  Administered 2016-11-11: 324 mg via ORAL
  Filled 2016-11-11: qty 4

## 2016-11-11 MED ORDER — CARVEDILOL 6.25 MG PO TABS
6.2500 mg | ORAL_TABLET | Freq: Two times a day (BID) | ORAL | Status: DC
  Administered 2016-11-12: 6.25 mg via ORAL
  Filled 2016-11-11: qty 1

## 2016-11-11 MED ORDER — TICAGRELOR 90 MG PO TABS
90.0000 mg | ORAL_TABLET | Freq: Two times a day (BID) | ORAL | Status: DC
  Administered 2016-11-12 (×2): 90 mg via ORAL
  Filled 2016-11-11 (×2): qty 1

## 2016-11-11 MED ORDER — INSULIN ASPART 100 UNIT/ML ~~LOC~~ SOLN
0.0000 [IU] | Freq: Three times a day (TID) | SUBCUTANEOUS | Status: DC
  Administered 2016-11-12: 5 [IU] via SUBCUTANEOUS

## 2016-11-11 MED ORDER — SODIUM CHLORIDE 0.9 % IV SOLN: INTRAVENOUS | Status: DC

## 2016-11-11 MED ORDER — SODIUM CHLORIDE 0.9 % IV SOLN
INTRAVENOUS | Status: DC
  Administered 2016-11-11 – 2016-11-12 (×2): via INTRAVENOUS

## 2016-11-11 MED ORDER — ASPIRIN EC 81 MG PO TBEC
81.0000 mg | DELAYED_RELEASE_TABLET | Freq: Every day | ORAL | Status: DC
  Administered 2016-11-12: 81 mg via ORAL
  Filled 2016-11-11: qty 1

## 2016-11-11 MED ORDER — ENOXAPARIN SODIUM 40 MG/0.4ML ~~LOC~~ SOLN
40.0000 mg | Freq: Every day | SUBCUTANEOUS | Status: DC
  Administered 2016-11-12: 40 mg via SUBCUTANEOUS
  Filled 2016-11-11: qty 0.4

## 2016-11-11 MED ORDER — MORPHINE SULFATE (PF) 4 MG/ML IV SOLN
4.0000 mg | INTRAVENOUS | Status: DC | PRN
  Administered 2016-11-11: 4 mg via INTRAVENOUS
  Filled 2016-11-11: qty 1

## 2016-11-11 MED ORDER — ONDANSETRON HCL 4 MG/2ML IJ SOLN
4.0000 mg | Freq: Once | INTRAMUSCULAR | Status: AC
  Administered 2016-11-11: 4 mg via INTRAVENOUS
  Filled 2016-11-11: qty 2

## 2016-11-11 MED ORDER — DEXTROSE 5 % IV SOLN: 1.0000 g | Freq: Once | INTRAVENOUS | Status: DC

## 2016-11-11 MED ORDER — ONDANSETRON HCL 4 MG/2ML IJ SOLN: 4.0000 mg | Freq: Four times a day (QID) | INTRAMUSCULAR | Status: DC | PRN

## 2016-11-11 MED ORDER — DEXTROSE 5 % IV SOLN: 1.0000 g | INTRAVENOUS | Status: DC

## 2016-11-11 MED ORDER — OXYCODONE HCL 5 MG PO TABS
5.0000 mg | ORAL_TABLET | ORAL | Status: DC | PRN
  Administered 2016-11-12: 5 mg via ORAL
  Filled 2016-11-11: qty 1

## 2016-11-11 MED ORDER — INSULIN ASPART 100 UNIT/ML ~~LOC~~ SOLN: 0.0000 [IU] | Freq: Every day | SUBCUTANEOUS | Status: DC

## 2016-11-11 MED ORDER — ONDANSETRON HCL 4 MG PO TABS: 4.0000 mg | ORAL_TABLET | Freq: Four times a day (QID) | ORAL | Status: DC | PRN

## 2016-11-11 NOTE — Progress Notes (Signed)
Pharmacy Antibiotic Note  Miguel Thornton is a 46 y.o. male admitted on 11/11/2016 with UTI.  Pharmacy has been consulted for Ceftriaxone dosing. WBC 12.8.   Plan: -Ceftriaxone 1g IV q24h -F/U urine culture for directed therapy  Height: 5\' 7"  (170.2 cm) Weight: 177 lb (80.3 kg) IBW/kg (Calculated) : 66.1  Temp (24hrs), Avg:97.9 F (36.6 C), Min:97.9 F (36.6 C), Max:97.9 F (36.6 C)   Recent Labs Lab 11/11/16 1647 11/11/16 2206  WBC 12.8*  --   CREATININE 1.77*  --   LATICACIDVEN  --  0.9    Estimated Creatinine Clearance: 52.4 mL/min (A) (by C-G formula based on SCr of 1.77 mg/dL (H)).    No Known Allergies   Abran Duke 11/11/2016 11:32 PM

## 2016-11-11 NOTE — H&P (Signed)
History and Physical  Patient Name: Miguel Thornton     ZOX:[id number]    DOB: 09/06/1970    DOA: 11/11/2016 PCP: Egbert Garibaldi, NP   Patient coming from: Home  Chief Complaint: Malaise, chest pain  HPI: Miguel Thornton is a 46 y.o. male with a past medical history significant for CAD s/p DES x2 in Nov 2017 and NIDDM who presents with dysuria and now chest pain.  The patient was in his usual state of health until about 3 days ago when he developed fatigue and malaise. For the next few days he noticed occasional drips of gross hematuria after urinating, urinary urgency, and dysuria radiated and on his penis and some mild right flank pain. Today he was a little bit dizzy and weak, noticed he was short of breath and had more angina than usual, which did not improve with his nitroglycerin so he came to the emergency room.  ED course: -Afebrile, heart rate 77, respirations 29, blood pressure 90/65 (baseline 110/70), pulse oximetry 99% on room air -Na 129, glucose 258, K 5.1, Cr 1.77 (baseline 0.8), WBC 12.8K, Hgb 16 -INR normal -Urinalysis showed RBC, WBC TNTC -Troponin negative -Chest x-ray clear -ECG showed no ST changes -She was given ceftriaxone, aspirin 325, and discussed with Cardiology Dr. Jacinto Halim who recommended cycling enzymes -TRH were asked to evaluate for chest pain     ROS: ROS        Past Medical History:  Diagnosis Date  . Coronary artery disease   . Myocardial infarction (HCC) 02/22/2016   "small"  . Type II diabetes mellitus (HCC)    "diet controlled" (05/23/2016)    Past Surgical History:  Procedure Laterality Date  . APPENDECTOMY    . CARDIAC CATHETERIZATION N/A 05/23/2016   Procedure: Left Heart Cath and Coronary Angiography;  Surgeon: Yates Decamp, MD;  Location: [facility] INVASIVE CV LAB;  Service: Cardiovascular;  Laterality: N/A;  . CARDIAC CATHETERIZATION N/A 05/23/2016   Procedure: Coronary/Bypass Graft CTO Intervention;  Surgeon: Yates Decamp, MD;  Location: [facility]  INVASIVE CV LAB;  Service: Cardiovascular;  Laterality: N/A;  . CORONARY ANGIOPLASTY WITH STENT PLACEMENT  05/23/2016  . LAPAROSCOPIC APPENDECTOMY N/A 02/17/2014   Procedure: APPENDECTOMY LAPAROSCOPIC;  Surgeon: Axel Filler, MD;  Location: [facility] OR;  Service: General;  Laterality: N/A;  . SKIN GRAFT SPLIT THICKNESS LEG / FOOT Right ~ 1987   "foot"    Social History: Patient lives alone, recently divorced.  The patient walks unassisted.  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  He is a former smoker, quit 10 years ago.  He is from [place] originally, works now in [place].    No Known Allergies  Family history: family history includes Congestive Heart Failure in his maternal grandfather and paternal grandfather; Diabetes in his paternal grandmother; Heart attack in his maternal grandfather and paternal grandfather.  Prior to Admission medications   Medication Sig Start Date End Date Taking? Authorizing Provider  aspirin EC 325 MG tablet Take 325 mg by mouth once.   Yes Historical Provider, MD  aspirin EC 81 MG tablet Take 81 mg by mouth daily.   Yes Historical Provider, MD  atorvastatin (LIPITOR) 40 MG tablet Take 40 mg by mouth daily.    Yes Historical Provider, MD  b complex vitamins tablet Take 1 tablet by mouth daily.   Yes Historical Provider, MD  carvedilol (COREG) 6.25 MG tablet Take 6.25 mg by mouth 2 (two)  times daily.   Yes Historical Provider, MD  lisinopril (PRINIVIL,ZESTRIL) 10 MG tablet Take 10 mg by mouth daily after supper.   Yes Historical Provider, MD  metFORMIN (GLUCOPHAGE) 500 MG tablet Take 500 mg by mouth 2 (two) times daily.   Yes Historical Provider, MD  Multiple Vitamin (MULTIVITAMIN WITH MINERALS) TABS tablet Take 1 tablet by mouth daily.   Yes Historical Provider, MD  nitroGLYCERIN (NITROSTAT) 0.4 MG SL tablet Place 0.4 mg under the tongue every 5 (five) minutes as needed for chest pain.   Yes Historical Provider,  MD  ticagrelor (BRILINTA) 90 MG TABS tablet Take 1 tablet (90 mg total) by mouth 2 (two) times daily. 05/24/16  Yes Yates Decamp, MD       Physical Exam: BP 100/77   Pulse 66   Temp 97.9 F (36.6 C) (Oral)   Resp (!) 24   Ht 5\' 7"  (1.702 m)   Wt 81.6 kg (180 lb)   SpO2 99%   BMI 28.19 kg/m  General appearance: Well-developed, adult male, alert and in no acute distress.   Eyes: Anicteric, conjunctiva pink, lids and lashes normal. PERRL.    ENT: No nasal deformity, discharge, epistaxis.  Hearing normal. OP moist without lesions.   Skin: Warm and dry.  No jaundice.  No suspicious rashes or lesions. Cardiac: RRR, nl S1-S2, no murmurs appreciated.  Capillary refill is brisk.  JVP normal.  No LE edema.  Radial and DP pulses 2+ and symmetric. Respiratory: Normal respiratory rate and rhythm.  CTAB without rales or wheezes. Abdomen: Abdomen soft.  No TTP. No ascites, distension, hepatosplenomegaly.   MSK: No deformities or effusions.  No cyanosis or clubbing. No CVA tenderness. Neuro: Cranial nerves normal.  Sensation intact to light touch. Speech is fluent.  Muscle strength normal.    Psych: Sensorium intact and responding to questions, attention normal.  Behavior appropriate.  Affect normal.  Judgment and insight appear normal.     Labs on Admission:  I have personally reviewed following labs and imaging studies: CBC:  Recent Labs Lab 11/11/16 1647  WBC 12.8*  HGB 16.1  HCT 45.3  MCV 86.3  PLT 250   Basic Metabolic Panel:  Recent Labs Lab 11/11/16 1647  NA 129*  K 5.1  CL 95*  CO2 19*  GLUCOSE 258*  BUN 24*  CREATININE 1.77*  CALCIUM 10.0   GFR: Estimated Creatinine Clearance: 52.8 mL/min (A) (by C-G formula based on SCr of 1.77 mg/dL (H)).  Liver Function Tests: No results for input(s): AST, ALT, ALKPHOS, BILITOT, PROT, ALBUMIN in the last 168 hours. No results for input(s): LIPASE, AMYLASE in the last 168 hours. No results for input(s): AMMONIA in the last 168  hours. Coagulation Profile:  Recent Labs Lab 11/11/16 1755  INR 1.07   Cardiac Enzymes: No results for input(s): CKTOTAL, CKMB, CKMBINDEX, TROPONINI in the last 168 hours. BNP (last 3 results) No results for input(s): PROBNP in the last 8760 hours. HbA1C: No results for input(s): HGBA1C in the last 72 hours. CBG: No results for input(s): GLUCAP in the last 168 hours. Lipid Profile: No results for input(s): CHOL, HDL, LDLCALC, TRIG, CHOLHDL, LDLDIRECT in the last 72 hours. Thyroid Function Tests: No results for input(s): TSH, T4TOTAL, FREET4, T3FREE, THYROIDAB in the last 72 hours. Anemia Panel: No results for input(s): VITAMINB12, FOLATE, FERRITIN, TIBC, IRON, RETICCTPCT in the last 72 hours. Sepsis Labs: Lactate pending Invalid input(s): PROCALCITONIN, LACTICIDVEN No results found for this or any previous visit (from the  past 240 hour(s)).       Radiological Exams on Admission: Personally reviewed CXR shows no focal opacity or pneumonia or edema: Dg Chest Port 1 View  Result Date: 11/11/2016 CLINICAL DATA:  Left-sided chest pain and shortness of breath for 1 day. Previous myocardial infarct. EXAM: PORTABLE CHEST 1 VIEW COMPARISON:  None. FINDINGS: The heart size and mediastinal contours are within normal limits. Both lungs are clear. The visualized skeletal structures are unremarkable. IMPRESSION: No active disease. Electronically Signed   By: Myles Rosenthal M.D.   On: 11/11/2016 17:35    EKG: Independently reviewed. Rate 87, QTc 394, peaked T waves, no ST changes.  LHC Nov 2017: Report reviewed 1. Normal LV systolic function, EF 55-60%. No wall motion of normality. 2. Left main mildly calcified. LAD proximal 80% stenosis followed by occlusion in the midsegment. Very small diffusely diseased D1 and small to moderate-sized D2 with a mid 80% stenosis. LAD fills faintly by ipsilateral and contralateral collaterals. 3. Moderate sized circumflex with small marginals. No significant  disease. 4. Large RCA with moderate size PL and PDA, no significant disease. Gives faint collaterals to the LAD. Interventional data: Successful PTCA and stenting of CTO of the proximal to mid to distal segment of the LAD with implantation of a overlapping 3.0 x 38 mm and 0.75 x 38 mm Promus premier DES.  Will need Dual antiplatelet therapy with Brilinta and ASA 81 mg for at least one year or longer due to complex disease.         Assessment/Plan  1. Sepsis from UTI:  Suspected source urine, less likely prostatitis. Organism unknown.   Patient meets criteria given tachypnea, leukocytosis, and evidence of organ dysfunction (hypotension and AKI, although mentating well).  Lactate pending.  Antibiotics delivered in the ED before cultures obtained.  -Sepsis bundle utilized:  -Blood and urine cultures drawn  -Check lactic acid, and size bolus pending  -Antibiotics: Ceftriaxone  -Repeat renal function and complete blood count in AM     2. Acute kidney injury:  Given UTI and hypotension. -Check FeNA -Fluids and trend Cr, I/Os  3. Coronary artery disease with angina:  Doubt ACS, suspect demand angina. -Continue Brilinta, aspirin, BB -Hold ACEi given AKI -Cycle enzymes  4. Hyponatremia:  Doesn't fully correct with hyperglycemia. Appears hypovolemic. -Fluids and trend Na  5. Type 2 diabetes:  -Hold home metformin -SSI with meals              DVT prophylaxis: Lovenox  Code Status: FULL  Family Communication: None present  Disposition Plan: Anticipate IV fluids, empiric antibiotics and follow cultures.  Cycle enzymes, follow renal function.  Consults called: Cardiology, Dr. Jacinto Halim will see. Admission status: OBS At the point of initial evaluation, it is my clinical opinion that admission for OBSERVATION is reasonable and necessary because the patient's presenting complaints in the context of their chronic conditions represent sufficient risk of deterioration or  significant morbidity to constitute reasonable grounds for close observation in the hospital setting, but that the patient may be medically stable for discharge from the hospital within 24 to 48 hours.    Medical decision making: Patient seen at 10:00 PM on 11/11/2016.  The patient was discussed with Dr. Fayrene Fearing.  What exists of the patient's chart was reviewed in depth and summarized above.  Clinical condition: BP improving in the ER, mentating well.        Alberteen Sam [facility] Pager [phone]

## 2016-11-11 NOTE — ED Notes (Signed)
Blood cultures collected after antibiotics started

## 2016-11-11 NOTE — ED Notes (Signed)
MD at bedside. 

## 2016-11-11 NOTE — ED Provider Notes (Signed)
[facility]-EMERGENCY DEPT Provider Note   CSN: [id number] Arrival date & time: 11/11/16  1628     History   Chief Complaint Chief Complaint  Patient presents with  . Chest Pain    HPI Miguel Thornton is a 46 y.o. male. Chief complaint is chest pain area and also complains of dysuria  HPI 46 year old male history of coronary artery disease. Follows with Dr. Clarice Pole. Had heart cath 2017 and had 2 coronary artery stents placed. Had some residual distal disease and diagonals. He states he was told "we would treat it  with medications".  Was at home today and had pain in his left upper chest. Became substernal and eventually somewhat to his right parasternal chest pressure described as a pressure. At times goes to his left jaw. He took nitroglycerin and improved. Given nitroglycerin here and has near improvement and resolution of symptoms. States that after taking nitroglycerin had feeling of dysuria. He arrives here complaining of dysuria and has difficulty voiding but was able to produce 50 mL of urine that appears purulent.  Fever shakes or chills. No back or flank pain. No other irritant voiding symptoms. No recent hematuria. No history of UTIs or prostatitis.  Past Medical History:  Diagnosis Date  . Coronary artery disease   . Myocardial infarction (HCC) 02/22/2016   "small"  . Type II diabetes mellitus (HCC)    "diet controlled" (05/23/2016)    Patient Active Problem List   Diagnosis Date Noted  . UTI (urinary tract infection) 11/11/2016  . Controlled type 2 diabetes mellitus without complication, without long-term current use of insulin (HCC) 11/11/2016  . Acute kidney injury (HCC) 11/11/2016  . Sepsis (HCC) 11/11/2016  . Post PTCA 05/23/2016  . Atherosclerosis of native coronary artery of native heart with angina pectoris (HCC) 05/21/2016  . Appendicitis 02/17/2014    Past Surgical History:  Procedure Laterality Date  . APPENDECTOMY    . CARDIAC CATHETERIZATION N/A 05/23/2016     Procedure: Left Heart Cath and Coronary Angiography;  Surgeon: Yates Decamp, MD;  Location: [facility] INVASIVE CV LAB;  Service: Cardiovascular;  Laterality: N/A;  . CARDIAC CATHETERIZATION N/A 05/23/2016   Procedure: Coronary/Bypass Graft CTO Intervention;  Surgeon: Yates Decamp, MD;  Location: [facility] INVASIVE CV LAB;  Service: Cardiovascular;  Laterality: N/A;  . CORONARY ANGIOPLASTY WITH STENT PLACEMENT  05/23/2016  . LAPAROSCOPIC APPENDECTOMY N/A 02/17/2014   Procedure: APPENDECTOMY LAPAROSCOPIC;  Surgeon: Axel Filler, MD;  Location: [facility] OR;  Service: General;  Laterality: N/A;  . SKIN GRAFT SPLIT THICKNESS LEG / FOOT Right ~ 1987   "foot"       Home Medications    Prior to Admission medications   Medication Sig Start Date End Date Taking? Authorizing Provider  aspirin EC 81 MG tablet Take 81 mg by mouth daily.   Yes [provider]  atorvastatin (LIPITOR) 40 MG tablet Take 40 mg by mouth daily.    Yes [provider]  b complex vitamins tablet Take 1 tablet by mouth daily.   Yes [provider]  carvedilol (COREG) 6.25 MG tablet Take 6.25 mg by mouth 2 (two) times daily.   Yes [provider]  lisinopril (PRINIVIL,ZESTRIL) 10 MG tablet Take 10 mg by mouth daily after supper.   Yes [provider]  Multiple Vitamin (MULTIVITAMIN WITH MINERALS) TABS tablet Take 1 tablet by mouth daily.   Yes [provider]  nitroGLYCERIN (NITROSTAT) 0.4 MG SL tablet Place 0.4 mg under the tongue every 5 (five) minutes  as needed for chest pain.   Yes [provider]  ticagrelor (BRILINTA) 90 MG TABS tablet Take 1 tablet (90 mg total) by mouth 2 (two) times daily. 05/24/16  Yes Yates Decamp, MD  cefpodoxime (VANTIN) 100 MG tablet Take 1 tablet (100 mg total) by mouth 2 (two) times daily. 11/12/16 11/22/16  Cleora Fleet, MD  metFORMIN (GLUCOPHAGE XR) 500 MG 24 hr tablet Take 2 tabs with breakfast and 2 tabs with supper 11/12/16   Cleora Fleet, MD     Family History Family History  Problem Relation Age of Onset  . Heart attack Paternal Grandfather   . Congestive Heart Failure Paternal Grandfather   . Heart attack Maternal Grandfather   . Congestive Heart Failure Maternal Grandfather   . Diabetes Paternal Grandmother     Social History Social History  Substance Use Topics  . Smoking status: Former Smoker    Packs/day: 1.00    Years: 18.00    Types: Cigarettes    Quit date: 09/15/2006  . Smokeless tobacco: Never Used  . Alcohol use Yes     Comment: 05/23/2016 "might have 1 drink/month; if that     Allergies   Patient has no known allergies.   Review of Systems Review of Systems  Constitutional: Negative for appetite change, chills, diaphoresis, fatigue and fever.  HENT: Negative for mouth sores, sore throat and trouble swallowing.   Eyes: Negative for visual disturbance.  Respiratory: Negative for cough, chest tightness, shortness of breath and wheezing.   Cardiovascular: Positive for chest pain.  Gastrointestinal: Negative for abdominal distention, abdominal pain, diarrhea, nausea and vomiting.  Endocrine: Negative for polydipsia, polyphagia and polyuria.  Genitourinary: Positive for dysuria. Negative for frequency and hematuria.  Musculoskeletal: Negative for gait problem.  Skin: Negative for color change, pallor and rash.  Neurological: Negative for dizziness, syncope, light-headedness and headaches.  Hematological: Does not bruise/bleed easily.  Psychiatric/Behavioral: Negative for behavioral problems and confusion.     Physical Exam Updated Vital Signs BP 104/64 (BP Location: Right Arm)   Pulse (!) 56   Temp 97.6 F (36.4 C) (Oral)   Resp 16   Ht 5\' 7"  (1.702 m)   Wt 177 lb (80.3 kg)   SpO2 96%   BMI 27.72 kg/m   Physical Exam  Constitutional: He is oriented to person, place, and time. He appears well-developed and well-nourished. No distress.  HENT:  Head: Normocephalic.  He is diaphoretic.   Eyes: Conjunctivae are normal. Pupils are equal, round, and reactive to light. No scleral icterus.  Neck: Normal range of motion. Neck supple. No thyromegaly present.  Cardiovascular: Normal rate and regular rhythm.  Exam reveals no gallop and no friction rub.   No murmur heard. Normal heart tones. No murmurs rubs or gallops. Sinus rhythm on monitor.  Pulmonary/Chest: Effort normal and breath sounds normal. No respiratory distress. He has no wheezes. He has no rales.  Abdominal: Soft. Bowel sounds are normal. He exhibits no distension. There is no tenderness. There is no rebound.  Musculoskeletal: Normal range of motion.  Neurological: He is alert and oriented to person, place, and time.  Skin: Skin is warm and dry. No rash noted.  Psychiatric: He has a normal mood and affect. His behavior is normal.     ED Treatments / Results  Labs (all labs ordered are listed, but only abnormal results are displayed) Labs Reviewed  CULTURE, BLOOD (ROUTINE X 2) - Abnormal; Notable for the following:  Result Value   Culture   (*)    Value: STAPHYLOCOCCUS SPECIES (COAGULASE NEGATIVE) THE SIGNIFICANCE OF ISOLATING THIS ORGANISM FROM A SINGLE SET OF BLOOD CULTURES WHEN MULTIPLE SETS ARE DRAWN IS UNCERTAIN. PLEASE NOTIFY THE MICROBIOLOGY DEPARTMENT WITHIN ONE WEEK IF SPECIATION AND SENSITIVITIES ARE REQUIRED.    All other components within normal limits  BLOOD CULTURE ID PANEL (REFLEXED) - Abnormal; Notable for the following:    Staphylococcus species DETECTED (*)    All other components within normal limits  BASIC METABOLIC PANEL - Abnormal; Notable for the following:    Sodium 129 (*)    Chloride 95 (*)    CO2 19 (*)    Glucose, Bld 258 (*)    BUN 24 (*)    Creatinine, Ser 1.77 (*)    GFR calc non Af Amer 44 (*)    GFR calc Af Amer 51 (*)    All other components within normal limits  CBC - Abnormal; Notable for the following:    WBC 12.8 (*)    All other components within normal limits   URINALYSIS, ROUTINE W REFLEX MICROSCOPIC - Abnormal; Notable for the following:    Color, Urine AMBER (*)    APPearance HAZY (*)    Hgb urine dipstick MODERATE (*)    Ketones, ur 20 (*)    Protein, ur 100 (*)    Leukocytes, UA LARGE (*)    Bacteria, UA RARE (*)    Squamous Epithelial / LPF 0-5 (*)    All other components within normal limits  HEMOGLOBIN A1C - Abnormal; Notable for the following:    Hgb A1c MFr Bld 8.9 (*)    All other components within normal limits  CBC - Abnormal; Notable for the following:    HCT 38.0 (*)    All other components within normal limits  COMPREHENSIVE METABOLIC PANEL - Abnormal; Notable for the following:    Sodium 133 (*)    Chloride 100 (*)    Glucose, Bld 286 (*)    BUN 27 (*)    Calcium 8.4 (*)    Total Protein 5.9 (*)    Albumin 3.4 (*)    All other components within normal limits  GLUCOSE, CAPILLARY - Abnormal; Notable for the following:    Glucose-Capillary 170 (*)    All other components within normal limits  GLUCOSE, CAPILLARY - Abnormal; Notable for the following:    Glucose-Capillary 219 (*)    All other components within normal limits  GLUCOSE, CAPILLARY - Abnormal; Notable for the following:    Glucose-Capillary 228 (*)    All other components within normal limits  URINE CULTURE  CULTURE, BLOOD (ROUTINE X 2)  MRSA PCR SCREENING  PROTIME-INR  LACTIC ACID, PLASMA  LACTIC ACID, PLASMA  HIV ANTIBODY (ROUTINE TESTING)  PROTIME-INR  APTT  TROPONIN I  TROPONIN I  I-STAT TROPOININ, ED  I-STAT TROPOININ, ED    EKG  EKG Interpretation  Date/Time:  Saturday November 11 2016 16:32:54 EDT Ventricular Rate:  87 PR Interval:  136 QRS Duration: 70 QT Interval:  328 QTC Calculation: 394 R Axis:   86 Text Interpretation:  Normal sinus rhythm Septal infarct , age undetermined Abnormal ECG Confirmed by Fayrene Fearing  MD, Mida Cory ([id number]) on 11/11/2016 4:46:01 PM       Radiology No results found.  Procedures Procedures (including critical  care time)  Medications Ordered in ED Medications  ondansetron (ZOFRAN) injection 4 mg (4 mg Intravenous Given 11/11/16 1951)  aspirin chewable tablet  324 mg (324 mg Oral Given 11/11/16 2138)  sodium chloride 0.9 % bolus 1,000 mL (1,000 mLs Intravenous Transfusing/Transfer 11/11/16 2232)     Initial Impression / Assessment and Plan / ED Course  I have reviewed the triage vital signs and the nursing notes.  Pertinent labs & imaging results that were available during my care of the patient were reviewed by me and considered in my medical decision making (see chart for details).     EKG shows inverted T waves inferiorly. No change versus comparison. He has prominent T waves again unchanged versus comparison 1117. Was given morphine becomes chest pain-free. Result later able to void his urine appears infected. Culture is pending and given Rocephin. Symptoms consistent with prostatitis. Prostate exam not performed. First troponin normal. I have placed a call to Dr.Ghangi discuss disposition  Final Clinical Impressions(s) / ED Diagnoses   Final diagnoses:  Chest pain    New Prescriptions Discharge Medication List as of 11/12/2016  1:33 PM    START taking these medications   Details  cefpodoxime (VANTIN) 100 MG tablet Take 1 tablet (100 mg total) by mouth 2 (two) times daily., Starting Sun 11/12/2016, Until Wed 11/22/2016, Normal    metFORMIN (GLUCOPHAGE XR) 500 MG 24 hr tablet Take 2 tabs with breakfast and 2 tabs with supper, Normal         Rolland Porter, MD 11/18/16 2303

## 2016-11-11 NOTE — ED Triage Notes (Signed)
Pt sent here from UC, c/o chest pain, pt appears in discomfort. Pt took 1 nitro today. HX of MI last year with stents.

## 2016-11-12 ENCOUNTER — Encounter (HOSPITAL_COMMUNITY): Payer: Self-pay | Admitting: Family Medicine

## 2016-11-12 DIAGNOSIS — N179 Acute kidney failure, unspecified: Secondary | ICD-10-CM | POA: Diagnosis not present

## 2016-11-12 DIAGNOSIS — E1165 Type 2 diabetes mellitus with hyperglycemia: Secondary | ICD-10-CM | POA: Diagnosis not present

## 2016-11-12 DIAGNOSIS — R319 Hematuria, unspecified: Secondary | ICD-10-CM

## 2016-11-12 DIAGNOSIS — E1151 Type 2 diabetes mellitus with diabetic peripheral angiopathy without gangrene: Secondary | ICD-10-CM | POA: Diagnosis not present

## 2016-11-12 DIAGNOSIS — A419 Sepsis, unspecified organism: Secondary | ICD-10-CM

## 2016-11-12 DIAGNOSIS — N390 Urinary tract infection, site not specified: Secondary | ICD-10-CM | POA: Diagnosis not present

## 2016-11-12 LAB — COMPREHENSIVE METABOLIC PANEL
ALK PHOS: 47 U/L (ref 38–126)
ALT: 21 U/L (ref 17–63)
ANION GAP: 11 (ref 5–15)
AST: 19 U/L (ref 15–41)
Albumin: 3.4 g/dL — ABNORMAL LOW (ref 3.5–5.0)
BILIRUBIN TOTAL: 0.3 mg/dL (ref 0.3–1.2)
BUN: 27 mg/dL — ABNORMAL HIGH (ref 6–20)
CALCIUM: 8.4 mg/dL — AB (ref 8.9–10.3)
CO2: 22 mmol/L (ref 22–32)
Chloride: 100 mmol/L — ABNORMAL LOW (ref 101–111)
Creatinine, Ser: 1.18 mg/dL (ref 0.61–1.24)
GFR calc Af Amer: >60 mL/min (ref >60)
Glucose, Bld: 286 mg/dL — ABNORMAL HIGH (ref 65–99)
POTASSIUM: 3.6 mmol/L (ref 3.5–5.1)
SODIUM: 133 mmol/L — AB (ref 135–145)
TOTAL PROTEIN: 5.9 g/dL — AB (ref 6.5–8.1)

## 2016-11-12 LAB — PROTIME-INR
INR: 1.14
PROTHROMBIN TIME: 14.7 s (ref 11.4–15.2)

## 2016-11-12 LAB — CBC
HCT: 38 % — ABNORMAL LOW (ref 39.0–52.0)
HEMOGLOBIN: 13.1 g/dL (ref 13.0–17.0)
MCH: 30.5 pg (ref 26.0–34.0)
MCHC: 34.5 g/dL (ref 30.0–36.0)
MCV: 88.4 fL (ref 78.0–100.0)
PLATELETS: 177 10*3/uL (ref 150–400)
RBC: 4.3 MIL/uL (ref 4.22–5.81)
RDW: 12.3 % (ref 11.5–15.5)
WBC: 7.1 10*3/uL (ref 4.0–10.5)

## 2016-11-12 LAB — MRSA PCR SCREENING: MRSA by PCR: NEGATIVE

## 2016-11-12 LAB — GLUCOSE, CAPILLARY
GLUCOSE-CAPILLARY: 228 mg/dL — AB (ref 65–99)
Glucose-Capillary: 170 mg/dL — ABNORMAL HIGH (ref 65–99)
Glucose-Capillary: 219 mg/dL — ABNORMAL HIGH (ref 65–99)

## 2016-11-12 LAB — TROPONIN I
Troponin I: <0.03 ng/mL (ref <0.03)
Troponin I: <0.03 ng/mL (ref <0.03)

## 2016-11-12 LAB — HIV ANTIBODY (ROUTINE TESTING W REFLEX): HIV Screen 4th Generation wRfx: NONREACTIVE

## 2016-11-12 LAB — APTT: APTT: 29 s (ref 24–36)

## 2016-11-12 LAB — LACTIC ACID, PLASMA: Lactic Acid, Venous: 0.8 mmol/L (ref 0.5–1.9)

## 2016-11-12 MED ORDER — CEFPODOXIME PROXETIL 100 MG PO TABS: 100.0000 mg | ORAL_TABLET | Freq: Two times a day (BID) | ORAL | 0 refills | Status: AC

## 2016-11-12 MED ORDER — PNEUMOCOCCAL VAC POLYVALENT 25 MCG/0.5ML IJ INJ: 0.5000 mL | INJECTION | Freq: Once | INTRAMUSCULAR | Status: DC

## 2016-11-12 MED ORDER — METFORMIN HCL ER 500 MG PO TB24: ORAL_TABLET | ORAL | 0 refills | Status: DC

## 2016-11-12 MED ORDER — INSULIN ASPART 100 UNIT/ML ~~LOC~~ SOLN
3.0000 [IU] | Freq: Three times a day (TID) | SUBCUTANEOUS | Status: DC
  Administered 2016-11-12: 3 [IU] via SUBCUTANEOUS

## 2016-11-12 NOTE — Progress Notes (Signed)
Patient received from the ED A/OX4 in NAD. VSS. No c/o  chest pain at this time. Will monitor closely overnight.

## 2016-11-12 NOTE — Discharge Summary (Signed)
Physician Discharge Summary  Miguel Thornton ZOX:[id number] DOB: [birth year] DOA: 11/11/2016  PCP: Egbert Garibaldi, NP  Admit date: 11/11/2016 Discharge date: 11/12/2016  Admitted From: Home  Disposition:  Home  Recommendations for Outpatient Follow-up:  1. Follow up with PCP in 3-5 days for recheck 2. Please follow up with cardiologist next month as scheduled 3. Please consider endocrinology referral 4. Please follow up on final urine and blood cultures  Please follow up with your primary care provider in 3-5 days.  Ask for endocrinology referral.   Seek medical care or return if symptoms worsen or new problems develop.  Please monitor your blood sugars closely.   Please take your antibiotics to completion.    Discharge Condition: STABLE  CODE STATUS: FULL  Diet recommendation: Heart Healthy / Carb Modified  Brief/Interim Summary: HPI: Miguel Thornton is a 46 y.o. male with a past medical history significant for CAD s/p DES x2 in Nov 2017 and NIDDM who presents with dysuria and now chest pain.  The patient was in his usual state of health until about 3 days ago when he developed fatigue and malaise. For the next few days he noticed occasional drips of gross hematuria after urinating, urinary urgency, and dysuria radiated and on his penis and some mild right flank pain. Today he was a little bit dizzy and weak, noticed he was short of breath and had more angina than usual, which did not improve with his nitroglycerin so he came to the emergency room.  ED course: -Afebrile, heart rate 77, respirations 29, blood pressure 90/65 (baseline 110/70), pulse oximetry 99% on room air -Na 129, glucose 258, K 5.1, Cr 1.77 (baseline 0.8), WBC 12.8K, Hgb 16 -INR normal -Urinalysis showed RBC, WBC TNTC -Troponin negative -Chest x-ray clear -ECG showed no ST changes -She was given ceftriaxone, aspirin 325, and discussed with Cardiology Dr. Jacinto Halim who recommended cycling enzymes -TRH were asked to  evaluate for chest pain  UTI with sepsis - Pt was noted to have an abnormal urinalysis and noted to have sepsis physiology.  He was immediately started on IVFs and IV ceftriaxone was started. Blood and urine cultures negative to date but still pending.  Pt had good recovery and felt much better.  He insisted that he be discharged home 4/29 for an important meeting and was unwilling to stay longer.  I told him to be sure to take his antibiotics and follow up with his PCP early this week for recheck and to follow up final blood and urine culture results.  He responded well to ceftriaxone so will discharge him on 10 days of cefpodoxime 100 BID with close outpatient follow up. Pt again insisted on discharge and would not stay longer in hospital.    AKI - resolved with IVF hydration.   CAD with angina - He was seen in consultation by his cardiologist Dr. Jacinto Halim.  His troponin tests have been negative.  I spoke with Dr. Jacinto Halim and he felt that he needed no further cardiac workup and could follow up with him in the office.  Also he was ok with him going home today.    Hyponatremia -corrected with IVF hydration.   Type 2 DM - poorly controlled, A1c pending, I have increased his metformin to Glucophage XR 500 mg take 2 tabs with breakfast and 2 tabs with supper, Without an A1c I can't know if he will need insulin or not but have advised him to follow up with his PCP and request  an endocrinology referral.  If we get his DM under better control he would likely have less infections like this one.      Discharge Diagnoses:  Principal Problem:   Sepsis (HCC) Active Problems:   UTI (urinary tract infection)   Controlled type 2 diabetes mellitus without complication, without long-term current use of insulin (HCC)   Acute kidney injury [facility])  Discharge Instructions   Allergies as of 11/12/2016   No Known Allergies     Medication List    STOP taking these medications   metFORMIN 500 MG tablet Commonly  known as:  GLUCOPHAGE Replaced by:  metFORMIN 500 MG 24 hr tablet     TAKE these medications   aspirin EC 81 MG tablet Take 81 mg by mouth daily. What changed:  Another medication with the same name was removed. Continue taking this medication, and follow the directions you see here.   atorvastatin 40 MG tablet Commonly known as:  LIPITOR Take 40 mg by mouth daily.   b complex vitamins tablet Take 1 tablet by mouth daily.   carvedilol 6.25 MG tablet Commonly known as:  COREG Take 6.25 mg by mouth 2 (two) times daily.   cefpodoxime 100 MG tablet Commonly known as:  VANTIN Take 1 tablet (100 mg total) by mouth 2 (two) times daily.   lisinopril 10 MG tablet Commonly known as:  PRINIVIL,ZESTRIL Take 10 mg by mouth daily after supper.   metFORMIN 500 MG 24 hr tablet Commonly known as:  GLUCOPHAGE XR Take 2 tabs with breakfast and 2 tabs with supper Replaces:  metFORMIN 500 MG tablet   multivitamin with minerals Tabs tablet Take 1 tablet by mouth daily.   nitroGLYCERIN 0.4 MG SL tablet Commonly known as:  NITROSTAT Place 0.4 mg under the tongue every 5 (five) minutes as needed for chest pain.   ticagrelor 90 MG Tabs tablet Commonly known as:  BRILINTA Take 1 tablet (90 mg total) by mouth 2 (two) times daily.      Follow-up Information    [place], Joelene Millin, NP. Schedule an appointment as soon as possible for a visit in 3 day(s).   Why:  Hospital Follow Up  Contact information: [facility] [address] [place] [address] [phone]        Yates Decamp, MD. Schedule an appointment as soon as possible for a visit in 3 week(s).   Specialty:  Cardiology Contact information: [address] [place] [address] [phone]          No Known Allergies Procedures/Studies: Dg Chest Port 1 View  Result Date: 11/11/2016 CLINICAL DATA:  Left-sided chest pain and shortness of breath for 1 day. Previous myocardial infarct.  EXAM: PORTABLE CHEST 1 VIEW COMPARISON:  None. FINDINGS: The heart size and mediastinal contours are within normal limits. Both lungs are clear. The visualized skeletal structures are unremarkable. IMPRESSION: No active disease. Electronically Signed   By: Myles Rosenthal M.D.   On: 11/11/2016 17:35    (Echo, Carotid, EGD, Colonoscopy, ERCP)    Subjective: Pt says he feels better and says he wants to go home, not willing to stay longer, says he has important appointment   Discharge Exam: Vitals:   11/12/16 1200 11/12/16 1201  BP: 104/64 104/64  Pulse:    Resp: 16 16  Temp:  97.6 F (36.4 C)   Vitals:   11/12/16 0730 11/12/16 0819 11/12/16 1200 11/12/16 1201  BP: 96/66  104/64 104/64  Pulse: Marland Kitchen)  56     Resp: 15  16 16   Temp:  97.4 F (36.3 C)  97.6 F (36.4 C)  TempSrc:  Oral  Oral  SpO2: 97%  98% 96%  Weight:      Height:        General: Pt is alert, awake, not in acute distress Cardiovascular: RRR, S1/S2 +, no rubs, no gallops Respiratory: CTA bilaterally, no wheezing, no rhonchi Abdominal: Soft, NT, ND, bowel sounds + Extremities: no edema, no cyanosis  The results of significant diagnostics from this hospitalization (including imaging, microbiology, ancillary and laboratory) are listed below for reference.     Microbiology: Recent Results (from the past 240 hour(s))  MRSA PCR Screening     Status: None   Collection Time: 11/11/16 11:20 PM  Result Value Ref Range Status   MRSA by PCR NEGATIVE NEGATIVE Final    Comment:        The GeneXpert MRSA Assay (FDA approved for NASAL specimens only), is one component of a comprehensive MRSA colonization surveillance program. It is not intended to diagnose MRSA infection nor to guide or monitor treatment for MRSA infections.      Labs: BNP (last 3 results) No results for input(s): BNP in the last 8760 hours. Basic Metabolic Panel:  Recent Labs Lab 11/11/16 1647 11/12/16 0332  NA 129* 133*  K 5.1 3.6  CL 95*  100*  CO2 19* 22  GLUCOSE 258* 286*  BUN 24* 27*  CREATININE 1.77* 1.18  CALCIUM 10.0 8.4*   Liver Function Tests:  Recent Labs Lab 11/12/16 0332  AST 19  ALT 21  ALKPHOS 47  BILITOT 0.3  PROT 5.9*  ALBUMIN 3.4*   No results for input(s): LIPASE, AMYLASE in the last 168 hours. No results for input(s): AMMONIA in the last 168 hours. CBC:  Recent Labs Lab 11/11/16 1647 11/12/16 0332  WBC 12.8* 7.1  HGB 16.1 13.1  HCT 45.3 38.0*  MCV 86.3 88.4  PLT 250 177   Cardiac Enzymes:  Recent Labs Lab 11/12/16 0332 11/12/16 1004  TROPONINI <0.03 <0.03   BNP: Invalid input(s): POCBNP CBG:  Recent Labs Lab 11/12/16 0007 11/12/16 0818 11/12/16 1203  GLUCAP 170* 219* 228*   D-Dimer No results for input(s): DDIMER in the last 72 hours. Hgb A1c No results for input(s): HGBA1C in the last 72 hours. Lipid Profile No results for input(s): CHOL, HDL, LDLCALC, TRIG, CHOLHDL, LDLDIRECT in the last 72 hours. Thyroid function studies No results for input(s): TSH, T4TOTAL, T3FREE, THYROIDAB in the last 72 hours.  Invalid input(s): FREET3 Anemia work up No results for input(s): VITAMINB12, FOLATE, FERRITIN, TIBC, IRON, RETICCTPCT in the last 72 hours. Urinalysis    Component Value Date/Time   COLORURINE AMBER (A) 11/11/2016 1926   APPEARANCEUR HAZY (A) 11/11/2016 1926   LABSPEC 1.030 11/11/2016 1926   PHURINE 5.0 11/11/2016 1926   GLUCOSEU NEGATIVE 11/11/2016 1926   HGBUR MODERATE (A) 11/11/2016 1926   BILIRUBINUR NEGATIVE 11/11/2016 1926   KETONESUR 20 (A) 11/11/2016 1926   PROTEINUR 100 (A) 11/11/2016 1926   UROBILINOGEN 2.0 (H) 02/17/2014 0851   NITRITE NEGATIVE 11/11/2016 1926   LEUKOCYTESUR LARGE (A) 11/11/2016 1926   Sepsis Labs Invalid input(s): PROCALCITONIN,  WBC,  LACTICIDVEN Microbiology Recent Results (from the past 240 hour(s))  MRSA PCR Screening     Status: None   Collection Time: 11/11/16 11:20 PM  Result Value Ref Range Status   MRSA by PCR  NEGATIVE NEGATIVE Final    Comment:  The GeneXpert MRSA Assay (FDA approved for NASAL specimens only), is one component of a comprehensive MRSA colonization surveillance program. It is not intended to diagnose MRSA infection nor to guide or monitor treatment for MRSA infections.    Time coordinating discharge: 34 minutes  SIGNED:  Standley Dakins, MD  [facility] 11/12/2016, 12:42 PM Pager [phone]  If 7PM-7AM, please contact night-coverage [email or web address] Password [email or web address]

## 2016-11-12 NOTE — Discharge Instructions (Signed)
Please follow up with your primary care provider in 3-5 days.  Ask for endocrinology referral.   Seek medical care or return if symptoms worsen or new problems develop.  Please monitor your blood sugars closely.   Please take your antibiotics to completion.

## 2016-11-12 NOTE — Consult Note (Addendum)
CARDIOLOGY CONSULT NOTE  Patient ID: Miguel Thornton MRN: [id number] DOB/AGE: 10-05-1970 45 y.o.  Admit date: 11/11/2016 Referring Physician  Standley Dakins, MD Primary Physician:  Egbert Garibaldi, NP Reason for Consultation  Angina pectoris  HPI: Miguel Thornton  is a 46 y.o. male  With Known coronary artery disease and has undergone stenting to his LAD on 04/22/2016 with implantation of 2 overlapping 3.0 x 28 and 3.0 x 38 mm Promus premier DES. Other vessels had mild disease. He has chronic stable angina pectoris. He is admitted to the hospital with an episode of chest pain that was not relieved with one sublingual nitroglycerin and hence presented to the hospital.  He also noticed lower abdominal discomfort and difficulty in urination, burning sensation in the urination, hematuria. He was also diagnosed with UTI. I was asked to see the patient due to chest pain that was not easily relieved with sublingual nitroglycerin.  Since admission to the hospital patient has not had any further recurrence of chest pain. He is ambulated in the hallway without any dyspnea or recurrence of chest pain. Presently states that he is feeling well, symptoms of frequency and burning sensation has all resolved.  Past Medical History:  Diagnosis Date  . Coronary artery disease   . Myocardial infarction (HCC) 02/22/2016   "small"  . Type II diabetes mellitus (HCC)    "diet controlled" (05/23/2016)     Past Surgical History:  Procedure Laterality Date  . APPENDECTOMY    . CARDIAC CATHETERIZATION N/A 05/23/2016   Procedure: Left Heart Cath and Coronary Angiography;  Surgeon: Yates Decamp, MD;  Location: [facility] INVASIVE CV LAB;  Service: Cardiovascular;  Laterality: N/A;  . CARDIAC CATHETERIZATION N/A 05/23/2016   Procedure: Coronary/Bypass Graft CTO Intervention;  Surgeon: Yates Decamp, MD;  Location: [facility] INVASIVE CV LAB;  Service: Cardiovascular;  Laterality: N/A;  . CORONARY ANGIOPLASTY WITH STENT PLACEMENT   05/23/2016  . LAPAROSCOPIC APPENDECTOMY N/A 02/17/2014   Procedure: APPENDECTOMY LAPAROSCOPIC;  Surgeon: Axel Filler, MD;  Location: [facility] OR;  Service: General;  Laterality: N/A;  . SKIN GRAFT SPLIT THICKNESS LEG / FOOT Right ~ 1987   "foot"     Family History  Problem Relation Age of Onset  . Heart attack Paternal Grandfather   . Congestive Heart Failure Paternal Grandfather   . Heart attack Maternal Grandfather   . Congestive Heart Failure Maternal Grandfather   . Diabetes Paternal Grandmother      Social History: Social History   Social History  . Marital status: Married    Spouse name: N/A  . Number of children: N/A  . Years of education: N/A   Occupational History  . Not on file.   Social History Main Topics  . Smoking status: Former Smoker    Packs/day: 1.00    Years: 18.00    Types: Cigarettes    Quit date: 09/15/2006  . Smokeless tobacco: Never Used  . Alcohol use Yes     Comment: 05/23/2016 "might have 1 drink/month; if that  . Drug use: Yes    Types: Marijuana     Comment: 05/23/2016 "quit in the 1990s"  . Sexual activity: Not on file   Other Topics Concern  . Not on file   Social History Narrative  . No narrative on file     Prescriptions Prior to Admission  Medication Sig Dispense Refill Last Dose  . aspirin EC 325 MG tablet Take 325 mg by mouth once.   1300-1400  . aspirin EC  81 MG tablet Take 81 mg by mouth daily.   11/11/2016 at am  . atorvastatin (LIPITOR) 40 MG tablet Take 40 mg by mouth daily.    maybe 4/28  . b complex vitamins tablet Take 1 tablet by mouth daily.   11/11/2016 at am  . carvedilol (COREG) 6.25 MG tablet Take 6.25 mg by mouth 2 (two) times daily.   11/11/2016 at 900  . lisinopril (PRINIVIL,ZESTRIL) 10 MG tablet Take 10 mg by mouth daily after supper.   11/10/2016 at pm  . metFORMIN (GLUCOPHAGE) 500 MG tablet Take 500 mg by mouth 2 (two) times daily.   11/11/2016 at am  . Multiple Vitamin (MULTIVITAMIN WITH MINERALS) TABS tablet Take 1  tablet by mouth daily.   11/11/2016 at am  . nitroGLYCERIN (NITROSTAT) 0.4 MG SL tablet Place 0.4 mg under the tongue every 5 (five) minutes as needed for chest pain.   11/11/2016 at 1600  . ticagrelor (BRILINTA) 90 MG TABS tablet Take 1 tablet (90 mg total) by mouth 2 (two) times daily. 60 tablet 0 11/11/2016 at 900     Review of Systems - Cardiovascular ROS: no chest pain or dyspnea on exertion Genito-Urinary ROS: positive for - dysuria and urinary frequency/urgency all other systems are negative.    Physical Exam: Blood pressure 96/66, pulse (!) 56, temperature 97.4 F (36.3 C), temperature source Oral, resp. rate 15, height 5\' 7"  (1.702 m), weight 80.3 kg (177 lb), SpO2 97 %.   General appearance: alert, cooperative, appears stated age and no distress Lungs: clear to auscultation bilaterally Heart: regular rate and rhythm, S1, S2 normal, no murmur, click, rub or gallop Abdomen: soft, non-tender; bowel sounds normal; no masses,  no organomegaly Extremities: extremities normal, atraumatic, no cyanosis or edema Pulses: 2+ and symmetric Neurologic: Grossly normal  Labs:  Lab Results  Component Value Date   WBC 7.1 11/12/2016   HGB 13.1 11/12/2016   HCT 38.0 (L) 11/12/2016   MCV 88.4 11/12/2016   PLT 177 11/12/2016    Recent Labs Lab 11/12/16 0332  NA 133*  K 3.6  CL 100*  CO2 22  BUN 27*  CREATININE 1.18  CALCIUM 8.4*  PROT 5.9*  BILITOT 0.3  ALKPHOS 47  ALT 21  AST 19  GLUCOSE 286*    Recent Labs  11/12/16 0332  TROPONINI <0.03    Lab Results  Component Value Date   TROPONINI <0.03 11/12/2016     TSH No results for input(s): TSH in the last 8760 hours.    Radiology: Dg Chest Port 1 View  Result Date: 11/11/2016 CLINICAL DATA:  Left-sided chest pain and shortness of breath for 1 day. Previous myocardial infarct. EXAM: PORTABLE CHEST 1 VIEW COMPARISON:  None. FINDINGS: The heart size and mediastinal contours are within normal limits. Both lungs are  clear. The visualized skeletal structures are unremarkable. IMPRESSION: No active disease. Electronically Signed   By: Myles Rosenthal M.D.   On: 11/11/2016 17:35    Scheduled Meds: . aspirin EC  81 mg Oral Daily  . atorvastatin  40 mg Oral Daily  . carvedilol  6.25 mg Oral BID WC  . enoxaparin (LOVENOX) injection  40 mg Subcutaneous Daily  . insulin aspart  0-15 Units Subcutaneous TID WC  . insulin aspart  0-5 Units Subcutaneous QHS  . insulin aspart  3 Units Subcutaneous TID WC  . pneumococcal 23 valent vaccine  0.5 mL Intramuscular Once  . sodium chloride flush  3 mL Intravenous Q12H  .  ticagrelor  90 mg Oral BID   Continuous Infusions: . sodium chloride 125 mL/hr at 11/12/16 0658  . cefTRIAXone (ROCEPHIN)  IV     PRN Meds:.acetaminophen **OR** acetaminophen, ondansetron **OR** ondansetron (ZOFRAN) IV, oxyCODONE  CARDIAC STUDIES:  EKG 11/12/2014: Normal sinus rhythm at the rate of 87 bpm, normal axis. No evidence of ischemia, normal EKG..  Out patient Echocardiogram 04/06/2016: Left ventricle cavity is normal in size. Normal global wall motion. Low normal LV systolic function. Normal diastolic filling pattern. Visual EF is 50-55%. Mild tricuspid regurgitation. No evidence of pulmonary hypertension. Pulmonary artery systolic pressure is estimated at 24 mm Hg. Mild pulmonic regurgitation. IVC is normal with poor inspiration collapse consistent with elevated right atrial pressure.   ASSESSMENT AND PLAN:  1. Chronic stable angina pectoris 2. CAD: stenting to his LAD on 04/22/2016 with implantation of 2 overlapping 3.0 x 28 and 3.0 x 38 mm Promus premier DES.  3. UTI 4. DM-2 Uncontrolled on insulin with vascular complication 5. Hyperlipidemia 6. Hypertension  Recommendation: Patient symptoms are completely resolved, EKG is nonischemic and serum troponins are negative. I have advised him to continue usual activity and he has an appointment to see me next month, keep the appointment.  Unless he has recurrence of angina pectoris and I'll see him back sooner.  Diabetes is well controlled and blood pressure and lipids are also well controlled. He's been compliant with all his medications. Treatment of UTI as per primary team.  Yates Decamp, MD 11/12/2016, 11:17 AM [facility] Pager: [phone] Office: [phone] If no answer Cell [phone]

## 2016-11-13 LAB — BLOOD CULTURE ID PANEL (REFLEXED)
Acinetobacter baumannii: NOT DETECTED
CANDIDA ALBICANS: NOT DETECTED
CANDIDA GLABRATA: NOT DETECTED
CANDIDA PARAPSILOSIS: NOT DETECTED
CANDIDA TROPICALIS: NOT DETECTED
Candida krusei: NOT DETECTED
ENTEROBACTER CLOACAE COMPLEX: NOT DETECTED
ENTEROBACTERIACEAE SPECIES: NOT DETECTED
ESCHERICHIA COLI: NOT DETECTED
Enterococcus species: NOT DETECTED
Haemophilus influenzae: NOT DETECTED
KLEBSIELLA OXYTOCA: NOT DETECTED
KLEBSIELLA PNEUMONIAE: NOT DETECTED
Listeria monocytogenes: NOT DETECTED
Methicillin resistance: NOT DETECTED
Neisseria meningitidis: NOT DETECTED
Proteus species: NOT DETECTED
Pseudomonas aeruginosa: NOT DETECTED
STAPHYLOCOCCUS AUREUS BCID: NOT DETECTED
STREPTOCOCCUS PNEUMONIAE: NOT DETECTED
STREPTOCOCCUS PYOGENES: NOT DETECTED
Serratia marcescens: NOT DETECTED
Staphylococcus species: DETECTED — AB
Streptococcus agalactiae: NOT DETECTED
Streptococcus species: NOT DETECTED

## 2016-11-13 LAB — URINE CULTURE: CULTURE: NO GROWTH

## 2016-11-13 LAB — HEMOGLOBIN A1C
Hgb A1c MFr Bld: 8.9 % — ABNORMAL HIGH (ref 4.8–5.6)
MEAN PLASMA GLUCOSE: 209 mg/dL

## 2016-11-14 LAB — CULTURE, BLOOD (ROUTINE X 2): SPECIAL REQUESTS: ADEQUATE

## 2016-11-17 LAB — CULTURE, BLOOD (ROUTINE X 2)
CULTURE: NO GROWTH
SPECIAL REQUESTS: ADEQUATE

## 2016-12-17 NOTE — H&P (Signed)
OFFICE VISIT NOTES COPIED TO EPIC FOR DOCUMENTATION  . History of Present Illness Suzy Bouchard FNP-C; 12/13/2016 4:52 PM) Patient words: Last OV 06/23/2016; Acute visit for cp and fatigue.  The patient is a 46 year old male who presents for a Follow-up for Coronary artery disease. Patient with exertional chest pain, has had abnormal nuclear stress test revealing anterolateral ischemia but had excellent exercise tolerance. Due to chest pain in spite of aggressive medical therapy he underwent coronary angiography on 05/23/2016 and underwent successful angioplasty to his proximal LAD and mid LAD which was occluded, Cx had mild disease.   He had noticed significant improvement in his overall energy, until one month ago when he began having left sided chest pain and fatigue. He presented to the emergency room on 11/11/2016 and was admitted for observation. He was found to have UTI and is cardiac testing was negative and he had no recurrence of chest pain since admission, it was felt he could follow-up with [place] at his previously scheduled office visit unless he developed symptoms of angina pectoris.  He called our office today requesting to be seen as he has continued to have chest pain for the last month that can occur at rest and with exertion. He continues to have fatigue and overall not feeling well. He is concerned as his symptoms are similar to prior to having cardiac stents placed. He admits to only taking nitroglycerin one or 2 times in the last month that did help his chest pain; however, did not have immediate relief with nitroglycerin. He denies any shortness of breath. He admits to not being as active as he wants was, but feels that due to his lack of energy that he cannot be as active as he was.   Problem List/Past Medical Victorino Dike [place]; 12/13/2016 2:53 PM) Coronary artery disease involving native coronary artery of native heart without angina pectoris (I25.10)  Coronary angiogram  05/23/2016: Prox LAD 80%, followed by occlusion S/P Promus premier DES 3.0x38 & 2.75x38 mm Stents. Mild disease Cx and RCA. Normal LVEF. Exercise sestamibi stress test 04/07/2016: 1. The resting electrocardiogram demonstrated normal sinus rhythm, normal resting conduction, no resting arrhythmias and normal rest repolarization. The stress electrocardiogram was normal. Non specific ST changes at peak normalized immediately in recovery. Patient exercised on Bruce protocol for 10:11 minutes and achieved 12.10 METS. Stress test terminated due to 86% MPHR achieved (Target HR >85%). Symptoms included fatigue and dyspnea. Excellent exercise tolerance, normal blood pressure response. 2. The perfusion imaging study demonstrates a small to at most moderate-sized severe ischemia in the anteroseptal no and apical septum wall extending from the mid ventricle. Left ventricular systolic function calculating QGS was moderately depressed at 39%. This represents an intermediate risk study, in view of excellent exercise tolerance, clinical correlation recommended. Diabetes mellitus, new onset (E11.9)  ABI 04/06/2016: This exam reveals normal perfusion of the lower extremity. RABI 0.98 with biphasic wavefroms and LABI 1.02 with biphasic waveform in the PT artery. Labwork  05/16/2016: CBC normal, glucose 163, creatinine 0.83, PT/INR normal 02/22/2016: HbA1c 10.4%, total cholesterol 222, triglycerides 148, HDL 45, LDL 147, glucose 281, creatinine 0.9, potassium 4.8, ALT 54, CMP otherwise normal, CBC normal Dyspnea on exertion (R06.09)  Echocardiogram 04/06/2016: Left ventricle cavity is normal in size. Normal global wall motion. Low normal LV systolic function. Normal diastolic filling pattern. Visual EF is 50-55%. Mild tricuspid regurgitation. No evidence of pulmonary hypertension. Pulmonary artery systolic pressure is estimated at 24 mm Hg. Mild pulmonic regurgitation. IVC  is normal with poor inspiration collapse consistent with  elevated right atrial pressure. Hyperlipidemia, group A (E78.00)  Abnormal nuclear stress test (R94.39)   Allergies Victorino Dike Sergeant; 12/13/2016 2:53 PM) No Known Allergies [03/10/2016]:  Family History Victorino Dike Sergeant; 12/13/2016 2:53 PM) Mother  Living, no known heart conditions, diabetes, htn Father  Deceased. at age 75, from accident, no known heart conditions Sister 1  younger, no known heart conditions  Social History Victorino Dike Sergeant; 12/13/2016 3:03 PM) Current tobacco use  Former smoker. quit 10 years ago, smoked for about 15 years Non Drinker/No Alcohol Use  Marital status  Separated. Living Situation  Lives with spouse. Number of Children  2. 1 passed from car accident, one living, healthy  Past Surgical History Victorino Dike Sergeant; 12/13/2016 2:53 PM) Appendectomy [2015]:  Medication History Victorino Dike Sergeant; 12/13/2016 3:06 PM) MetFORMIN HCl ER (MOD) (500MG  Tablet ER 24HR, 2 Oral two times daily) Active. Coreg (6.25MG  Tablet, 1 (one) Oral two times daily, Taken starting 09/11/2016) Active. Nitroglycerin (0.4MG  Tab Sublingual, 1 (one) Sublingual every 5 minutes as needed for chest pain., Taken starting 09/11/2016) Active. Brilinta (90MG  Tablet, 1 (one) Oral two times daily, Taken starting 09/11/2016) Active. Lisinopril (10MG  Tablet, 1 (one) Tablet Oral every evening after dinner, Taken starting 09/11/2016) Active. Atorvastatin Calcium (20MG  Tablet, 1 Oral daily, Taken starting 09/04/2016) Active. (Note dose change) Aspirin (81MG  Tablet Chewable, 1 Oral daily) Active. Medications Reconciled (verbally with pt; no list or medication present)  Diagnostic Studies History Victorino Dike Sergeant; 12/13/2016 2:53 PM) ABI's [04/06/2016]: This exam reveals normal perfusion of the lower extremity. RABI 0.98 with biphasic wavefroms and LABI 1.02 with biphasic waveform in the PT artery. Coronary Angiogram [05/23/2016]: Prox LAD 80%, follwoed by occlusion S/P  Promus premier DES 3.0x38 & 2.75x38 mm Stents. Mild disease Cx and RCA. Normal LVEF. Echocardiogram [04/06/2016]: Left ventricle cavity is normal in size. Normal global wall motion. Low normal LV systolic function. Normal diastolic filling pattern. Visual EF is 50-55%. Mild tricuspid regurgitation. No evidence of pulmonary hypertension. Pulmonary artery systolic pressure is estimated at 24 mm Hg. Mild pulmonic regurgitation. IVC is normal with poor inspiration collapse consistent with elevated right atrial pressure. Nuclear stress test [04/07/2016]: 1. The resting electrocardiogram demonstrated normal sinus rhythm, normal resting conduction, no resting arrhythmias and normal rest repolarization. The stress electrocardiogram was normal. Non specific ST changes at peak normalized immediately in recovery. Patient exercised on Bruce protocol for 10:11 minutes and achieved 12.10 METS. Stress test terminated due to 86% MPHR achieved (Target HR >85%). Symptoms included fatigue and dyspnea. Excellent exercise tolerance, normal blood pressure response. 2. The perfusion imaging study demonstrates a small to at most moderate-sized severe ischemia in the anteroseptal no and apical septum wall extending from the mid ventricle. Left ventricular systolic function calculating QGS was moderately depressed at 39%. This represents an intermediate risk study, in view of excellent exercise tolerance, clinical correlation recommended.    Review of Systems Suzy Bouchard FNP-C; 12/13/2016 3:13 PM) General Present- Fatigue. Not Present- Anorexia and Fever. Respiratory Not Present- Cough. Cardiovascular Present- Chest Pain. Not Present- Claudications, Edema, Orthopnea, Palpitations and Paroxysmal Nocturnal Dyspnea. Gastrointestinal Not Present- Black, Tarry Stool, Change in Bowel Habits and Nausea. Neurological Not Present- Focal Neurological Symptoms and Syncope. Endocrine Not Present- Cold Intolerance, Excessive  Sweating, Heat Intolerance and Thyroid Problems. Hematology Not Present- Anemia, Easy Bruising, Petechiae and Prolonged Bleeding.  Vitals Victorino Dike Sergeant; 12/13/2016 3:06 PM) 12/13/2016 2:59 PM Weight: 181.06 lb Height: 67in Body Surface Area: 1.94 m Body Mass Index: 28.36  kg/m  Pulse: 79 (Regular)  P.OX: 98% (Room air) BP: 92/62 (Sitting, Left Arm, Standard)       Physical Exam Suzy Bouchard, FNP-C; 12/13/2016 4:53 PM) General Mental Status-Alert. General Appearance-Cooperative, Appears stated age, Not in acute distress. Build & Nutrition-Moderately built and Mildly obese.  Head and Neck Thyroid Gland Characteristics - no palpable nodules, no palpable enlargement.  Chest and Lung Exam Palpation Tender - No chest wall tenderness. Auscultation Breath sounds - Clear.  Cardiovascular Inspection Jugular vein - Right - No Distention. Auscultation Heart Sounds - S1 WNL, S2 WNL and No gallop present. Murmurs & Other Heart Sounds - Murmur - No murmur.  Abdomen Palpation/Percussion Normal exam - Non Tender and No hepatosplenomegaly. Auscultation Normal exam - Bowel sounds normal.  Peripheral Vascular Lower Extremity Inspection - Bilateral - Inspection Normal. Palpation - Edema - Bilateral - No edema. Femoral pulse - Bilateral - Normal. Popliteal pulse - Bilateral - Feeble. Dorsalis pedis pulse - Left - Feeble. Right - 1+. Bilateral - Normal. Posterior tibial pulse - Bilateral - Feeble. Carotid arteries - Bilateral-No Carotid bruit. Abdomen-No prominent abdominal aortic pulsation, No epigastric bruit.  Neurologic Neurologic evaluation reveals -alert and oriented x 3 with no impairment of recent or remote memory. Motor-Grossly intact without any focal deficits.  Musculoskeletal Global Assessment Left Lower Extremity - normal range of motion without pain. Right Lower Extremity - normal range of motion without pain.    Assessment & Plan  Suzy Bouchard FNP-C; 12/13/2016 4:53 PM) Angina pectoris (I20.9) Impression: EKG 12/13/2016: Normal sinus rhythm at 62 bpm, normal axis, normal interval, no evidence of ischemia. Normal EKG. Atherosclerosis of native coronary artery of native heart with angina pectoris (I25.119) Story: Coronary angiogram 05/23/2016: Prox LAD 80%, followed by occlusion S/P Promus premier DES 3.0x38 & 2.75x38 mm Stents. Mild disease Cx and RCA. Normal LVEF.  Exercise sestamibi stress test 04/07/2016: 1. The resting electrocardiogram demonstrated normal sinus rhythm, normal resting conduction, no resting arrhythmias and normal rest repolarization. The stress electrocardiogram was normal. Non specific ST changes at peak normalized immediately in recovery. Patient exercised on Bruce protocol for 10:11 minutes and achieved 12.10 METS. Stress test terminated due to 86% MPHR achieved (Target HR >85%). Symptoms included fatigue and dyspnea. Excellent exercise tolerance, normal blood pressure response. 2. The perfusion imaging study demonstrates a small to at most moderate-sized severe ischemia in the anteroseptal no and apical septum wall extending from the mid ventricle. Left ventricular systolic function calculating QGS was moderately depressed at 39%. This represents an intermediate risk study, in view of excellent exercise tolerance, clinical correlation recommended. Impression: EKG 06/23/2016: Sinus rhythm at rate of 80 bpm, normal axis. No evidence of ischemia, normal EKG.  EKG 05/15/2016: Normal sinus rhythm at rate of 62 bpm, normal axis. Nonspecific ST depression and T-wave flattening in lead 3 isolated lead. Compared to EKG 03/10/2016, T change new. Current Plans Complete electrocardiogram (93000) Diabetes mellitus, new onset (E11.9) Story: ABI 04/06/2016: This exam reveals normal perfusion of the lower extremity. RABI 0.98 with biphasic wavefroms and LABI 1.02 with biphasic waveform in the PT  artery. Labwork Story:  12/13/2016: Cholesterol 107, triglycerides 64, HDL 43, LDL 51.  CBC normal.  Glucose 132, creatinine 0.84, potassium 4.8, CMP normal. 05/16/2016: CBC normal, glucose 163, creatinine 0.83, PT/INR normal  02/22/2016: HbA1c 10.4%, total cholesterol 222, triglycerides 148, HDL 45, LDL 147, glucose 281, creatinine 0.9, potassium 4.8, ALT 54, CMP otherwise normal, CBC normal Hyperlipidemia, group A (E78.00) Current Plans Mechanism of underlying  disease process and action of medications discussed with the patient. I discussed primary/secondary prevention and also dietary counseling was done. Patient presents for acute visit for persistent chest pain and fatigue. Patient reports over the last month he has had on and off episodes of chest pain they can occur at rest or with exertion. His main complaint is not feeling well and persistent fatigue. He states he feels like he did prior to having his stents placed in November of last year. He admits to not being as active as he was several months ago; however, reports no acute is working 2 jobs. Coronary angiogram in November showed mild disease in the circumflex and RCA. Given his similar symptoms, felt that we should proceed with coronary angiogram for further evaluation. Schedule for cardiac catheterization, and possible angioplasty. We discussed regarding risks, benefits, alternatives to this including stress testing, CTA and continued medical therapy. Patient wants to proceed. Understands <1-2% risk of death, stroke, MI, urgent CABG, bleeding, infection, renal failure but not limited to these. He underwent labs today. No changes were made to medications today, will have him follow up after the procedure for re-evaluation.  *I have discussed this case with Dr. Jacinto Halim and he personally examined the patient and participated in formulating the plan.*  CC: Marva Panda, NP  Signed electronically by Suzy Bouchard, FNP-C  (12/13/2016 4:54 PM)

## 2016-12-19 ENCOUNTER — Ambulatory Visit (HOSPITAL_COMMUNITY)
Admission: RE | Admit: 2016-12-19 | Discharge: 2016-12-19 | Disposition: A | Discharge status: Home / Self Care | Payer: BLUE CROSS/BLUE SHIELD | Source: Ambulatory Visit | Attending: Cardiology | Admitting: Cardiology

## 2016-12-19 ENCOUNTER — Encounter (HOSPITAL_COMMUNITY)
Admission: RE | Disposition: A | Discharge status: Home / Self Care | Payer: Self-pay | Source: Ambulatory Visit | Attending: Cardiology

## 2016-12-19 DIAGNOSIS — I2584 Coronary atherosclerosis due to calcified coronary lesion: Secondary | ICD-10-CM | POA: Insufficient documentation

## 2016-12-19 DIAGNOSIS — Z955 Presence of coronary angioplasty implant and graft: Secondary | ICD-10-CM | POA: Insufficient documentation

## 2016-12-19 DIAGNOSIS — Z87891 Personal history of nicotine dependence: Secondary | ICD-10-CM | POA: Diagnosis not present

## 2016-12-19 DIAGNOSIS — I10 Essential (primary) hypertension: Secondary | ICD-10-CM | POA: Diagnosis not present

## 2016-12-19 DIAGNOSIS — I2582 Chronic total occlusion of coronary artery: Secondary | ICD-10-CM | POA: Diagnosis not present

## 2016-12-19 DIAGNOSIS — I25119 Atherosclerotic heart disease of native coronary artery with unspecified angina pectoris: Secondary | ICD-10-CM | POA: Diagnosis present

## 2016-12-19 DIAGNOSIS — E7800 Pure hypercholesterolemia, unspecified: Secondary | ICD-10-CM | POA: Diagnosis not present

## 2016-12-19 DIAGNOSIS — Z7982 Long term (current) use of aspirin: Secondary | ICD-10-CM | POA: Diagnosis not present

## 2016-12-19 DIAGNOSIS — Z7902 Long term (current) use of antithrombotics/antiplatelets: Secondary | ICD-10-CM | POA: Diagnosis not present

## 2016-12-19 DIAGNOSIS — Z7984 Long term (current) use of oral hypoglycemic drugs: Secondary | ICD-10-CM | POA: Insufficient documentation

## 2016-12-19 DIAGNOSIS — I25118 Atherosclerotic heart disease of native coronary artery with other forms of angina pectoris: Secondary | ICD-10-CM | POA: Insufficient documentation

## 2016-12-19 DIAGNOSIS — E119 Type 2 diabetes mellitus without complications: Secondary | ICD-10-CM | POA: Insufficient documentation

## 2016-12-19 DIAGNOSIS — I209 Angina pectoris, unspecified: Secondary | ICD-10-CM | POA: Diagnosis present

## 2016-12-19 HISTORY — PX: LEFT HEART CATH AND CORONARY ANGIOGRAPHY: CATH118249

## 2016-12-19 HISTORY — PX: CORONARY STENT INTERVENTION: CATH118234

## 2016-12-19 LAB — POCT ACTIVATED CLOTTING TIME: ACTIVATED CLOTTING TIME: 307 s

## 2016-12-19 LAB — GLUCOSE, CAPILLARY: Glucose-Capillary: 140 mg/dL — ABNORMAL HIGH (ref 65–99)

## 2016-12-19 LAB — PROTIME-INR
INR: 1.02
Prothrombin Time: 13.4 seconds (ref 11.4–15.2)

## 2016-12-19 SURGERY — LEFT HEART CATH AND CORONARY ANGIOGRAPHY
Anesthesia: LOCAL

## 2016-12-19 MED ORDER — IOPAMIDOL (ISOVUE-370) INJECTION 76%
INTRAVENOUS | Status: AC
  Filled 2016-12-19: qty 100

## 2016-12-19 MED ORDER — HYDROMORPHONE HCL 1 MG/ML IJ SOLN
INTRAMUSCULAR | Status: AC
Start: 2016-12-19 — End: 2016-12-19
  Filled 2016-12-19: qty 0.5

## 2016-12-19 MED ORDER — SODIUM CHLORIDE 0.9% FLUSH: 3.0000 mL | Freq: Two times a day (BID) | INTRAVENOUS | Status: DC

## 2016-12-19 MED ORDER — METFORMIN HCL ER 500 MG PO TB24: 1000.0000 mg | ORAL_TABLET | Freq: Two times a day (BID) | ORAL | Status: AC

## 2016-12-19 MED ORDER — SODIUM CHLORIDE 0.9 % WEIGHT BASED INFUSION: 1.0000 mL/kg/h | INTRAVENOUS | Status: DC

## 2016-12-19 MED ORDER — SODIUM CHLORIDE 0.9 % IV SOLN: INTRAVENOUS | Status: AC

## 2016-12-19 MED ORDER — SODIUM CHLORIDE 0.9% FLUSH: 3.0000 mL | INTRAVENOUS | Status: DC | PRN

## 2016-12-19 MED ORDER — NITROGLYCERIN 1 MG/10 ML FOR IR/CATH LAB
INTRA_ARTERIAL | Status: DC | PRN
Start: 2016-12-19 — End: 2016-12-19
  Administered 2016-12-19: 200 ug
  Administered 2016-12-19: 100 ug

## 2016-12-19 MED ORDER — MIDAZOLAM HCL 2 MG/2ML IJ SOLN
INTRAMUSCULAR | Status: DC | PRN
  Administered 2016-12-19: 2 mg via INTRAVENOUS
  Administered 2016-12-19: 1 mg via INTRAVENOUS

## 2016-12-19 MED ORDER — HEPARIN (PORCINE) IN NACL 2-0.9 UNIT/ML-% IJ SOLN
INTRAMUSCULAR | Status: AC
Start: 2016-12-19 — End: 2016-12-19
  Filled 2016-12-19: qty 1000

## 2016-12-19 MED ORDER — LIDOCAINE HCL (PF) 1 % IJ SOLN
INTRAMUSCULAR | Status: DC | PRN
  Administered 2016-12-19 (×2): 5 mL via INTRA_ARTERIAL

## 2016-12-19 MED ORDER — VERAPAMIL HCL 2.5 MG/ML IV SOLN
INTRAVENOUS | Status: DC | PRN
  Administered 2016-12-19: 200 ug via INTRACORONARY
  Administered 2016-12-19 (×2): 100 ug via INTRACORONARY

## 2016-12-19 MED ORDER — MIDAZOLAM HCL 2 MG/2ML IJ SOLN
INTRAMUSCULAR | Status: AC
  Filled 2016-12-19: qty 2

## 2016-12-19 MED ORDER — IOPAMIDOL (ISOVUE-370) INJECTION 76%
INTRAVENOUS | Status: DC | PRN
  Administered 2016-12-19: 150 mL via INTRA_ARTERIAL

## 2016-12-19 MED ORDER — SODIUM CHLORIDE 0.9 % IV SOLN: 250.0000 mL | INTRAVENOUS | Status: DC | PRN

## 2016-12-19 MED ORDER — HEPARIN SODIUM (PORCINE) 1000 UNIT/ML IJ SOLN
INTRAMUSCULAR | Status: DC | PRN
  Administered 2016-12-19 (×2): 5000 [IU] via INTRAVENOUS

## 2016-12-19 MED ORDER — VERAPAMIL HCL 2.5 MG/ML IV SOLN
INTRAVENOUS | Status: AC
  Filled 2016-12-19: qty 2

## 2016-12-19 MED ORDER — HYDROMORPHONE HCL 1 MG/ML IJ SOLN
INTRAMUSCULAR | Status: AC
  Filled 2016-12-19: qty 0.5

## 2016-12-19 MED ORDER — LIDOCAINE HCL 1 % IJ SOLN
INTRAMUSCULAR | Status: AC
  Filled 2016-12-19: qty 20

## 2016-12-19 MED ORDER — HYDROMORPHONE HCL 1 MG/ML IJ SOLN
INTRAMUSCULAR | Status: DC | PRN
  Administered 2016-12-19 (×3): 0.5 mg via INTRAVENOUS

## 2016-12-19 MED ORDER — HEPARIN SODIUM (PORCINE) 1000 UNIT/ML IJ SOLN
INTRAMUSCULAR | Status: AC
  Filled 2016-12-19: qty 1

## 2016-12-19 MED ORDER — RIVAROXABAN 10 MG PO TABS: 10.0000 mg | ORAL_TABLET | Freq: Every day | ORAL | 3 refills | Status: AC

## 2016-12-19 MED ORDER — LABETALOL HCL 5 MG/ML IV SOLN: 10.0000 mg | INTRAVENOUS | Status: DC | PRN

## 2016-12-19 MED ORDER — SODIUM CHLORIDE 0.9 % WEIGHT BASED INFUSION
3.0000 mL/kg/h | INTRAVENOUS | Status: AC
  Administered 2016-12-19: 3 mL/kg/h via INTRAVENOUS
  Administered 2016-12-19 (×2): 500 mL via INTRAVENOUS

## 2016-12-19 MED ORDER — LIDOCAINE HCL (PF) 1 % IJ SOLN
INTRAMUSCULAR | Status: DC | PRN
  Administered 2016-12-19: 3 mL

## 2016-12-19 MED ORDER — HEPARIN (PORCINE) IN NACL 2-0.9 UNIT/ML-% IJ SOLN
INTRAMUSCULAR | Status: AC | PRN
  Administered 2016-12-19: 1000 mL

## 2016-12-19 MED ORDER — HYDRALAZINE HCL 20 MG/ML IJ SOLN: 5.0000 mg | INTRAMUSCULAR | Status: DC | PRN

## 2016-12-19 MED ORDER — ASPIRIN 81 MG PO CHEW: 81.0000 mg | CHEWABLE_TABLET | ORAL | Status: DC

## 2016-12-19 MED FILL — XARELTO 10 MG TABLET: 10 | 30 days supply | Qty: 30 | Fill #0

## 2016-12-19 SURGICAL SUPPLY — 18 items
BALLN EUPHORA RX 2.5X25 (BALLOONS) ×2
BALLOON EUPHORA RX 2.5X25 (BALLOONS) ×1 IMPLANT
CATH EXTRAC PRONTO 5.5F 138CM (CATHETERS) ×2 IMPLANT
CATH LAUNCHER 6FR AL1 (CATHETERS) ×1 IMPLANT
CATH LAUNCHER 6FR EBU3.5 (CATHETERS) ×2 IMPLANT
CATH OPTITORQUE TIG 4.0 5F (CATHETERS) ×2 IMPLANT
CATHETER LAUNCHER 6FR AL1 (CATHETERS) ×2
DEVICE RAD COMP TR BAND LRG (VASCULAR PRODUCTS) ×2 IMPLANT
GLIDESHEATH SLEND A-KIT 6F 20G (SHEATH) ×2 IMPLANT
GUIDEWIRE INQWIRE 1.5J.035X260 (WIRE) ×1 IMPLANT
INQWIRE 1.5J .035X260CM (WIRE) ×2
KIT ENCORE 26 ADVANTAGE (KITS) ×2 IMPLANT
KIT HEART LEFT (KITS) ×2 IMPLANT
PACK CARDIAC CATHETERIZATION (CUSTOM PROCEDURE TRAY) ×2 IMPLANT
STENT PROMUS PREM MR 2.5X12 (Permanent Stent) ×2 IMPLANT
TRANSDUCER W/STOPCOCK (MISCELLANEOUS) ×2 IMPLANT
TUBING CIL FLEX 10 FLL-RA (TUBING) ×2 IMPLANT
WIRE RUNTHROUGH .014X180CM (WIRE) ×2 IMPLANT

## 2016-12-19 NOTE — Interval H&P Note (Signed)
History and Physical Interval Note:  12/19/2016 8:38 AM  Miguel Thornton  has presented today for surgery, with the diagnosis of cp  The various methods of treatment have been discussed with the patient and family. After consideration of risks, benefits and other options for treatment, the patient has consented to  Procedure(s): Left Heart Cath and Coronary Angiography (N/A) and possible PCI as a surgical intervention .  The patient's history has been reviewed, patient examined, no change in status, stable for surgery.  I have reviewed the patient's chart and labs.  Questions were answered to the patient's satisfaction.   Ischemic Symptoms? CCS III (Marked limitation of ordinary activity) Anti-ischemic Medical Therapy? Minimal Therapy (1 class of medications) Non-invasive Test Results? No non-invasive testing performed Prior CABG? No Previous CABG   Patient Information:   1-2V CAD, no prox LAD  A (7)  Indication: 20; Score: 7   Patient Information:   1-2V-CAD with DS 50-60% With No FFR, No IVUS  I (3)  Indication: 21; Score: 3   Patient Information:   1-2V-CAD with DS 50-60% With FFR  A (7)  Indication: 22; Score: 7   Patient Information:   1-2V-CAD with DS 50-60% With FFR>0.8, IVUS not significant  I (2)  Indication: 23; Score: 2   Patient Information:   3V-CAD without LMCA With Abnormal LV systolic function  A (9)  Indication: 48; Score: 9   Patient Information:   LMCA-CAD  A (9)  Indication: 49; Score: 9   Patient Information:   2V-CAD with prox LAD PCI  A (7)  Indication: 62; Score: 7   Patient Information:   2V-CAD with prox LAD CABG  A (8)  Indication: 62; Score: 8   Patient Information:   3V-CAD without LMCA With Low CAD burden(i.e., 3 focal stenoses, low SYNTAX score) PCI  A (7)  Indication: 63; Score: 7   Patient Information:   3V-CAD without LMCA With Low CAD burden(i.e., 3 focal stenoses, low SYNTAX score) CABG  A (9)   Indication: 63; Score: 9   Patient Information:   3V-CAD without LMCA E06c - Intermediate-high CAD burden (i.e., multiple diffuse lesions, presence of CTO, or high SYNTAX score) PCI  U (4)  Indication: 64; Score: 4   Patient Information:   3V-CAD without LMCA E06c - Intermediate-high CAD burden (i.e., multiple diffuse lesions, presence of CTO, or high SYNTAX score) CABG  A (9)  Indication: 64; Score: 9   Patient Information:   LMCA-CAD With Isolated LMCA stenosis  PCI  U (6)  Indication: 65; Score: 6   Patient Information:   LMCA-CAD With Isolated LMCA stenosis  CABG  A (9)  Indication: 65; Score: 9   Patient Information:   LMCA-CAD Additional CAD, low CAD burden (i.e., 1- to 2-vessel additional involvement, low SYNTAX score) PCI  U (5)  Indication: 66; Score: 5   Patient Information:   LMCA-CAD Additional CAD, low CAD burden (i.e., 1- to 2-vessel additional involvement, low SYNTAX score) CABG  A (9)  Indication: 66; Score: 9   Patient Information:   LMCA-CAD Additional CAD, intermediate-high CAD burden (i.e., 3-vessel involvement, presence of CTO, or high SYNTAX score) PCI  I (3)  Indication: 67; Score: 3   Patient Information:   LMCA-CAD Additional CAD, intermediate-high CAD burden (i.e., 3-vessel involvement, presence of CTO, or high SYNTAX score) CABG  A (9)  Indication: 67; Score: 9   Miguel Thornton

## 2016-12-19 NOTE — Progress Notes (Signed)
Patient's access site healthy. Asymptomatic. Mother is at bedside and patient want's to go home and is stable for discharge.

## 2016-12-19 NOTE — Discharge Instructions (Signed)

## 2016-12-19 NOTE — Progress Notes (Signed)
[id number] Education completed with pt and mother who voiced understanding. Stressed importance of brilinta with stent. Reviewed NTG use, heart healthy eating, ex ed, watching carbs. Pt works two full time jobs with about 4 hours of sleep. Not able to attend CRP 2 [place] at this time due to this work schedule. Stated this should be changing in the future. Will refer to CRP 2 and pt stated he will call when he can attend. Left brochure for [place]. Luetta Nutting RN BSN 12/19/2016 2:14 PM

## 2016-12-20 ENCOUNTER — Encounter (HOSPITAL_COMMUNITY): Payer: Self-pay | Admitting: Cardiology

## 2018-12-13 ENCOUNTER — Ambulatory Visit: Payer: Self-pay | Admitting: Cardiology

## 2018-12-13 NOTE — Progress Notes (Deleted)
Follow up visit  Subjective:   Miguel Thornton, male    DOB: Mar 22, 1971, 48 y.o.   MRN: [id number]   No chief complaint on file.   *** HPI  48 year old Caucasian male with coronary artery disease status post PCI to proximal to mid LAD 2017, stent thrombosis in 12/2016 requiring thrombectomy and repeat stenting, hypertension, hyperlipidemia, history of tobacco abuse.  At last visit in 2019, I recommend aspirin 81 mg lifelong, Brilinta 60 mg twice daily for long-term dual antiplatelet therapy given his history of stent thrombosis. I recommended starting rosuvastatin 40 mg daily. I recommended repeating lipid panel in 3 months, andIf still suboptimal with elevated TG, will add Vascepa 2 g bid. I continued Coreg 6.25 mg twice daily and metformin 500 mg twice daily.  Given his low-normal A1c and concern for causing hypoglycemia, I did not start him on Jardiance.    *** Past Medical History:  Diagnosis Date  . Coronary artery disease   . Myocardial infarction (HCC) 02/22/2016   "small"  . Type II diabetes mellitus (HCC)    "diet controlled" (05/23/2016)    *** Past Surgical History:  Procedure Laterality Date  . APPENDECTOMY    . CARDIAC CATHETERIZATION N/A 05/23/2016   Procedure: Left Heart Cath and Coronary Angiography;  Surgeon: Yates Decamp, MD;  Location: [facility] INVASIVE CV LAB;  Service: Cardiovascular;  Laterality: N/A;  . CARDIAC CATHETERIZATION N/A 05/23/2016   Procedure: Coronary/Bypass Graft CTO Intervention;  Surgeon: Yates Decamp, MD;  Location: [facility] INVASIVE CV LAB;  Service: Cardiovascular;  Laterality: N/A;  . CORONARY ANGIOPLASTY WITH STENT PLACEMENT  05/23/2016  . CORONARY STENT INTERVENTION N/A 12/19/2016   Procedure: Coronary Stent Intervention;  Surgeon: Yates Decamp, MD;  Location: [facility] INVASIVE CV LAB;  Service: Cardiovascular;  Laterality: N/A;  . LAPAROSCOPIC APPENDECTOMY N/A 02/17/2014   Procedure: APPENDECTOMY LAPAROSCOPIC;  Surgeon: Axel Filler, MD;  Location: [facility] OR;   Service: General;  Laterality: N/A;  . LEFT HEART CATH AND CORONARY ANGIOGRAPHY N/A 12/19/2016   Procedure: Left Heart Cath and Coronary Angiography;  Surgeon: Yates Decamp, MD;  Location: [facility] INVASIVE CV LAB;  Service: Cardiovascular;  Laterality: N/A;  . SKIN GRAFT SPLIT THICKNESS LEG / FOOT Right ~ 1987   "foot"    *** Social History   Socioeconomic History  . Marital status: Married    Spouse name: Not on file  . Number of children: Not on file  . Years of education: Not on file  . Highest education level: Not on file  Occupational History  . Not on file  Social Needs  . Financial resource strain: Not on file  . Food insecurity:    Worry: Not on file    Inability: Not on file  . Transportation needs:    Medical: Not on file    Non-medical: Not on file  Tobacco Use  . Smoking status: Former Smoker    Packs/day: 1.00    Years: 18.00    Pack years: 18.00    Types: Cigarettes    Last attempt to quit: 09/15/2006    Years since quitting: 12.2  . Smokeless tobacco: Never Used  Substance and Sexual Activity  . Alcohol use: Yes    Comment: 05/23/2016 "might have 1 drink/month; if that  . Drug use: Yes    Types: Marijuana    Comment: 05/23/2016 "quit in the 1990s"  . Sexual activity: Not on file  Lifestyle  . Physical activity:    Days per week: Not on file  Minutes per session: Not on file  . Stress: Not on file  Relationships  . Social connections:    Talks on phone: Not on file    Gets together: Not on file    Attends religious service: Not on file    Active member of club or organization: Not on file    Attends meetings of clubs or organizations: Not on file    Relationship status: Not on file  . Intimate partner violence:    Fear of current or ex partner: Not on file    Emotionally abused: Not on file    Physically abused: Not on file    Forced sexual activity: Not on file  Other Topics Concern  . Not on file  Social History Narrative  . Not on file    ***  Family History  Problem Relation Age of Onset  . Heart attack Paternal Grandfather   . Congestive Heart Failure Paternal Grandfather   . Heart attack Maternal Grandfather   . Congestive Heart Failure Maternal Grandfather   . Diabetes Paternal Grandmother     *** Current Outpatient Medications on File Prior to Visit  Medication Sig Dispense Refill  . atorvastatin (LIPITOR) 20 MG tablet Take 20 mg by mouth daily.     Marland Kitchen b complex vitamins tablet Take 1 tablet by mouth daily.    . carvedilol (COREG) 6.25 MG tablet Take 6.25 mg by mouth 2 (two) times daily.    Marland Kitchen lisinopril (PRINIVIL,ZESTRIL) 10 MG tablet Take 10 mg by mouth daily after supper.    . metFORMIN (GLUCOPHAGE XR) 500 MG 24 hr tablet Take 2 tablets (1,000 mg total) by mouth 2 (two) times daily.    . Multiple Vitamin (MULTIVITAMIN WITH MINERALS) TABS tablet Take 1 tablet by mouth daily.    . nitroGLYCERIN (NITROSTAT) 0.4 MG SL tablet Place 0.4 mg under the tongue every 5 (five) minutes as needed for chest pain.    . rivaroxaban (XARELTO) 10 MG TABS tablet Take 1 tablet (10 mg total) by mouth daily. 30 tablet 3  . ticagrelor (BRILINTA) 90 MG TABS tablet Take 1 tablet (90 mg total) by mouth 2 (two) times daily. 60 tablet 0   No current facility-administered medications on file prior to visit.     Cardiovascular studies:  *** Coronary angiogram January 14, 2017:  Normal LV systolic function, EF 50% with borderline global hypokinesis. Mild disease in the circumflex. Normal RCA. Mid LAD is occluded with TIMI 0 flow at the previously stented segment.  Successful PTCA, thrombectomy followed by stenting of the distal LAD with 2.5 x 12 mm Promus premier DES for thrombotic occluded distal LAD at the outflow of the previously placed stents to the CTO LAD on 05/23/2016 with long overlapping 3.0 x 38 and 2.75 x 38 mm Promus premier DES into the mid and distal LAD.   Recommendation: Given long stents in the LAD, his blood pressure being  borderline low, he is a high risk of subacute to late stent thrombosis until the stent is endothelialized. I'll start the patient on Xarelto 10 mg after dinner daily and continue Brilinta and discontinue aspirin for at least a period of 12 weeks. 2.5 mg dosages not available. Patient is aware of non-FDA approved utilization of Xarelto. He'll be discharged home today with outpatient follow-up.  Coronary angiogram 05/23/2016: 1. Normal LV systolic function, EF 55-60%. No wall motion abnormality. 2. Left main mildly calcified. LAD proximal 80% stenosis followed by occlusion in the midsegment. Very small diffusely diseased  D1 and small to moderate-sized D2 with a mid 80% stenosis. LAD fills faintly by ipsilateral and contralateral collaterals. 3. Moderate sized circumflex with small marginals. No significant disease. 4. Large RCA with moderate size PL and PDA, no significant disease. Gives faint collaterals to the LAD.  Interventional data: Successful PTCA and stenting of CTO of the proximal to mid to distal segment of the LAD with implantation of a overlapping 3.0 x 38 mm and 2.75 x 38 mm Promus premier DES.    ABI 04/06/2016:  This exam reveals normal perfusion of the  lower extremity.  RABI 0.98 with biphasic wavefroms and LABI 1.02 with biphasic waveform in the PT artery.  Recent labs: ***  *** ROS      *** There were no vitals filed for this visit.  There is no height or weight on file to calculate BMI. There were no vitals filed for this visit.  *** Objective:   Physical Exam        Assessment & Recommendations:   48 year old Caucasian male with coronary artery disease status post PCI to proximal to mid LAD 2017, stent thrombosis in 12/2016 requiring thrombectomy and repeat stenting, hypertension, hyperlipidemia, history of tobacco abuse.  ***   Marzell Isakson Emiliano Dyer, MD [facility] [place] Pager: [phone] Office: [phone] If no answer Cell [phone]

## 2021-01-12 ENCOUNTER — Emergency Department (HOSPITAL_COMMUNITY): Payer: No Typology Code available for payment source

## 2021-01-12 ENCOUNTER — Encounter (HOSPITAL_COMMUNITY): Payer: Self-pay

## 2021-01-12 ENCOUNTER — Other Ambulatory Visit: Payer: Self-pay

## 2021-01-12 ENCOUNTER — Emergency Department (HOSPITAL_COMMUNITY)
Admission: EM | Admit: 2021-01-12 | Discharge: 2021-01-12 | Disposition: A | Discharge status: Home / Self Care | Payer: No Typology Code available for payment source | Attending: Emergency Medicine | Admitting: Emergency Medicine

## 2021-01-12 DIAGNOSIS — Z7901 Long term (current) use of anticoagulants: Secondary | ICD-10-CM | POA: Diagnosis not present

## 2021-01-12 DIAGNOSIS — E119 Type 2 diabetes mellitus without complications: Secondary | ICD-10-CM | POA: Insufficient documentation

## 2021-01-12 DIAGNOSIS — W228XXA Striking against or struck by other objects, initial encounter: Secondary | ICD-10-CM | POA: Diagnosis not present

## 2021-01-12 DIAGNOSIS — S0031XA Abrasion of nose, initial encounter: Secondary | ICD-10-CM | POA: Insufficient documentation

## 2021-01-12 DIAGNOSIS — Z7984 Long term (current) use of oral hypoglycemic drugs: Secondary | ICD-10-CM | POA: Insufficient documentation

## 2021-01-12 DIAGNOSIS — I25118 Atherosclerotic heart disease of native coronary artery with other forms of angina pectoris: Secondary | ICD-10-CM | POA: Diagnosis not present

## 2021-01-12 DIAGNOSIS — R079 Chest pain, unspecified: Secondary | ICD-10-CM

## 2021-01-12 DIAGNOSIS — R072 Precordial pain: Secondary | ICD-10-CM | POA: Insufficient documentation

## 2021-01-12 DIAGNOSIS — Y990 Civilian activity done for income or pay: Secondary | ICD-10-CM | POA: Insufficient documentation

## 2021-01-12 DIAGNOSIS — Z87891 Personal history of nicotine dependence: Secondary | ICD-10-CM | POA: Insufficient documentation

## 2021-01-12 DIAGNOSIS — S0992XA Unspecified injury of nose, initial encounter: Secondary | ICD-10-CM | POA: Diagnosis present

## 2021-01-12 LAB — CBC WITH DIFFERENTIAL/PLATELET
Abs Immature Granulocytes: 0.02 10*3/uL (ref 0.00–0.07)
Basophils Absolute: 0.1 10*3/uL (ref 0.0–0.1)
Basophils Relative: 1 %
Eosinophils Absolute: 0.1 10*3/uL (ref 0.0–0.5)
Eosinophils Relative: 3 %
HCT: 40.9 % (ref 39.0–52.0)
Hemoglobin: 13.6 g/dL (ref 13.0–17.0)
Immature Granulocytes: 0 %
Lymphocytes Relative: 24 %
Lymphs Abs: 1.3 10*3/uL (ref 0.7–4.0)
MCH: 30.6 pg (ref 26.0–34.0)
MCHC: 33.3 g/dL (ref 30.0–36.0)
MCV: 91.9 fL (ref 80.0–100.0)
Monocytes Absolute: 0.5 10*3/uL (ref 0.1–1.0)
Monocytes Relative: 8 %
Neutro Abs: 3.4 10*3/uL (ref 1.7–7.7)
Neutrophils Relative %: 64 %
Platelets: 228 10*3/uL (ref 150–400)
RBC: 4.45 MIL/uL (ref 4.22–5.81)
RDW: 13.6 % (ref 11.5–15.5)
WBC: 5.4 10*3/uL (ref 4.0–10.5)
nRBC: 0 % (ref 0.0–0.2)

## 2021-01-12 LAB — COMPREHENSIVE METABOLIC PANEL
ALT: 25 U/L (ref 0–44)
AST: 22 U/L (ref 15–41)
Albumin: 3.7 g/dL (ref 3.5–5.0)
Alkaline Phosphatase: 47 U/L (ref 38–126)
Anion gap: 8 (ref 5–15)
BUN: 18 mg/dL (ref 6–20)
CO2: 26 mmol/L (ref 22–32)
Calcium: 9.4 mg/dL (ref 8.9–10.3)
Chloride: 103 mmol/L (ref 98–111)
Creatinine, Ser: 1.14 mg/dL (ref 0.61–1.24)
GFR, Estimated: >60 mL/min (ref >60)
Glucose, Bld: 134 mg/dL — ABNORMAL HIGH (ref 70–99)
Potassium: 3.9 mmol/L (ref 3.5–5.1)
Sodium: 137 mmol/L (ref 135–145)
Total Bilirubin: 0.4 mg/dL (ref 0.3–1.2)
Total Protein: 6.9 g/dL (ref 6.5–8.1)

## 2021-01-12 LAB — TROPONIN I (HIGH SENSITIVITY)
Troponin I (High Sensitivity): 5 ng/L (ref <18)
Troponin I (High Sensitivity): 5 ng/L (ref <18)

## 2021-01-12 NOTE — ED Provider Notes (Signed)
Emergency Medicine Provider Triage Evaluation Note  Miguel Thornton , a 50 y.o. male  was evaluated in triage.  Pt complains of chest pain.  Patient was at work and a machine showed a tiny shard of metal which struck him in the nose.  It bled considerably given that he is on clopidogrel.  However, there is no heavy impact or concern for nasal fracture.  Perhaps due to the excitement, he developed onset of left-sided chest pain and his employer advised him to come to the ED for work-up given his history of CAD and MI.  He states this feels different from when he had an MI.  He has mild associated abdominal discomfort, likely given that he had not eaten.  He got a snack and was feeling better.  Denied any associated shortness of breath, nausea, diaphoresis.  He tried taking nitroglycerin, without relief.  Review of Systems  Positive: Chest pain, nasal injury. Negative: Shortness of breath, cough, fevers, chills, diaphoresis.  Physical Exam  BP (!) 151/98 (BP Location: Right Arm)   Pulse 81   Temp 98.2 F (36.8 C) (Oral)   Resp 16   SpO2 99%  Gen:   Awake, no distress   Resp:  Normal effort  MSK:   Moves extremities without difficulty  Other:  Nose looks okay.  Superficial.  Bleeding controlled.  No septal hematomas.  Medical Decision Making  Medically screening exam initiated at 11:01 AM.  Appropriate orders placed.  Miguel Thornton was informed that the remainder of the evaluation will be completed by another provider, this initial triage assessment does not replace that evaluation, and the importance of remaining in the ED until their evaluation is complete.  Chest pain rule-out   Lorelee New, PA-C 01/12/21 1101    Pricilla Loveless, MD 01/15/21 [phone]

## 2021-01-12 NOTE — ED Triage Notes (Signed)
Pt presents with nose bleeding/injury d.t a piece of metal hitting him in his nose while at work today. Pt states, "it scared me so bad it made my heart hurt"

## 2021-01-12 NOTE — Discharge Instructions (Addendum)
Follow up with your cardiologist for recheck  

## 2021-01-12 NOTE — ED Provider Notes (Signed)
[facility] EMERGENCY DEPARTMENT Provider Note   CSN: [id number] Arrival date & time: 01/12/21  1015     History Chief Complaint  Patient presents with   Facial Injury    Miguel Thornton is a 50 y.o. male.  Pt reports a piece of metal from a machine at his job hit him in the nose.  Pt is on plavix and had a lot of bleeding.  Pt report he experienced chest pain after the event and came in to be seen for the chest pain  Pt has a history of CAD and had stents placed in 2017.  Pt reports no pain currently.  No shortness of breath.    The history is provided by the patient. No language interpreter was used.  Facial Injury Mechanism of injury:  Direct blow Location:  Nose Chest Pain Pain location:  Substernal area Pain quality: aching   Pain radiates to:  Does not radiate Chronicity:  New Relieved by:  Nothing Ineffective treatments:  None tried Associated symptoms: no cough, no fever and no shortness of breath   Risk factors: coronary artery disease       Past Medical History:  Diagnosis Date   Coronary artery disease    Myocardial infarction (HCC) 02/22/2016   "small"   Type II diabetes mellitus (HCC)    "diet controlled" (05/23/2016)    Patient Active Problem List   Diagnosis Date Noted   UTI (urinary tract infection) 11/11/2016   Controlled type 2 diabetes mellitus without complication, without long-term current use of insulin (HCC) 11/11/2016   Acute kidney injury (HCC) 11/11/2016   Sepsis (HCC) 11/11/2016   Post PTCA 05/23/2016   Atherosclerosis of native coronary artery of native heart with angina pectoris (HCC) 05/21/2016   Appendicitis 02/17/2014    Past Surgical History:  Procedure Laterality Date   APPENDECTOMY     CARDIAC CATHETERIZATION N/A 05/23/2016   Procedure: Left Heart Cath and Coronary Angiography;  Surgeon: Yates Decamp, MD;  Location: [facility] INVASIVE CV LAB;  Service: Cardiovascular;  Laterality: N/A;   CARDIAC CATHETERIZATION N/A  05/23/2016   Procedure: Coronary/Bypass Graft CTO Intervention;  Surgeon: Yates Decamp, MD;  Location: [facility] INVASIVE CV LAB;  Service: Cardiovascular;  Laterality: N/A;   CORONARY ANGIOPLASTY WITH STENT PLACEMENT  05/23/2016   CORONARY STENT INTERVENTION N/A 12/19/2016   Procedure: Coronary Stent Intervention;  Surgeon: Yates Decamp, MD;  Location: [facility] INVASIVE CV LAB;  Service: Cardiovascular;  Laterality: N/A;   LAPAROSCOPIC APPENDECTOMY N/A 02/17/2014   Procedure: APPENDECTOMY LAPAROSCOPIC;  Surgeon: Axel Filler, MD;  Location: [facility] OR;  Service: General;  Laterality: N/A;   LEFT HEART CATH AND CORONARY ANGIOGRAPHY N/A 12/19/2016   Procedure: Left Heart Cath and Coronary Angiography;  Surgeon: Yates Decamp, MD;  Location: [facility] INVASIVE CV LAB;  Service: Cardiovascular;  Laterality: N/A;   SKIN GRAFT SPLIT THICKNESS LEG / FOOT Right ~ 1987   "foot"       Family History  Problem Relation Age of Onset   Heart attack Paternal Grandfather    Congestive Heart Failure Paternal Grandfather    Heart attack Maternal Grandfather    Congestive Heart Failure Maternal Grandfather    Diabetes Paternal Grandmother     Social History   Tobacco Use   Smoking status: Former    Packs/day: 1.00    Years: 18.00    Pack years: 18.00    Types: Cigarettes    Quit date: 09/15/2006    Years since quitting: 14.3  Smokeless tobacco: Never  Substance Use Topics   Alcohol use: Yes    Comment: 05/23/2016 "might have 1 drink/month; if that   Drug use: Yes    Types: Marijuana    Comment: 05/23/2016 "quit in the 1990s"    Home Medications Prior to Admission medications   Medication Sig Start Date End Date Taking? Authorizing Provider  atorvastatin (LIPITOR) 20 MG tablet Take 20 mg by mouth daily.     [provider]  b complex vitamins tablet Take 1 tablet by mouth daily.    [provider]  carvedilol (COREG) 6.25 MG tablet Take 6.25 mg by mouth 2 (two) times daily.    [provider]   lisinopril (PRINIVIL,ZESTRIL) 10 MG tablet Take 10 mg by mouth daily after supper.    [provider]  metFORMIN (GLUCOPHAGE XR) 500 MG 24 hr tablet Take 2 tablets (1,000 mg total) by mouth 2 (two) times daily. 12/21/16   Yates Decamp, MD  Multiple Vitamin (MULTIVITAMIN WITH MINERALS) TABS tablet Take 1 tablet by mouth daily.    [provider]  nitroGLYCERIN (NITROSTAT) 0.4 MG SL tablet Place 0.4 mg under the tongue every 5 (five) minutes as needed for chest pain.    [provider]  rivaroxaban (XARELTO) 10 MG TABS tablet Take 1 tablet (10 mg total) by mouth daily. 12/19/16   Yates Decamp, MD  ticagrelor (BRILINTA) 90 MG TABS tablet Take 1 tablet (90 mg total) by mouth 2 (two) times daily. 05/24/16   Yates Decamp, MD    Allergies    Patient has no known allergies.  Review of Systems   Review of Systems  Constitutional:  Negative for chills and fever.  Respiratory:  Negative for cough and shortness of breath.   Cardiovascular:  Positive for chest pain.  All other systems reviewed and are negative.  Physical Exam Updated Vital Signs BP 120/88 (BP Location: Left Arm)   Pulse (!) 59   Temp 98 F (36.7 C) (Oral)   Resp 16   SpO2 100%   Physical Exam Vitals reviewed.  Constitutional:      Appearance: Normal appearance.  HENT:     Head: Normocephalic.     Nose:     Comments: Small abrasion nose   no gapping     Mouth/Throat:     Mouth: Mucous membranes are moist.  Eyes:     Pupils: Pupils are equal, round, and reactive to light.  Cardiovascular:     Rate and Rhythm: Normal rate and regular rhythm.  Skin:    General: Skin is warm.  Neurological:     General: No focal deficit present.     Mental Status: He is alert.  Psychiatric:        Mood and Affect: Mood normal.    ED Results / Procedures / Treatments   Labs (all labs ordered are listed, but only abnormal results are displayed) Labs Reviewed  COMPREHENSIVE METABOLIC PANEL - Abnormal; Notable for  the following components:      Result Value   Glucose, Bld 134 (*)    All other components within normal limits  CBC WITH DIFFERENTIAL/PLATELET  TROPONIN I (HIGH SENSITIVITY)  TROPONIN I (HIGH SENSITIVITY)    EKG EKG Interpretation  Date/Time:  Wednesday January 12 2021 10:56:15 EDT Ventricular Rate:  69 PR Interval:  156 QRS Duration: 84 QT Interval:  358 QTC Calculation: 383 R Axis:   65 Text Interpretation: Normal sinus rhythm Septal infarct , age undetermined Abnormal ECG  Confirmed by Kristine Royal [id number]) on 01/12/2021 4:55:40 PM  Radiology DG Chest 1 View  Result Date: 01/12/2021 CLINICAL DATA:  Chest pain EXAM: CHEST  1 VIEW COMPARISON:  November 11, 2016 FINDINGS: Lungs are clear. Heart size and pulmonary vascularity are normal. Stent noted in left heart region. No adenopathy. No pneumothorax. No bone lesions. IMPRESSION: Lungs clear.  Stable cardiac silhouette. Electronically Signed   By: Bretta Bang III M.D.   On: 01/12/2021 11:47    Procedures Procedures   Medications Ordered in ED Medications - No data to display  ED Course  I have reviewed the triage vital signs and the nursing notes.  Pertinent labs & imaging results that were available during my care of the patient were reviewed by me and considered in my medical decision making (see chart for details).    MDM Rules/Calculators/A&P                          MDM:  Troponin is negative x 2  EKG no acute abnormality,  chest xray normal.  Pt advised to follow up with his cardiologist Final Clinical Impression(s) / ED Diagnoses Final diagnoses:  Chest pain    Rx / DC Orders ED Discharge Orders     None     An After Visit Summary was printed and given to the patient. An After Visit Summary was printed and given to the patient.     Elson Areas, [place] 01/12/21 1658    Wynetta Fines, MD 01/13/21 [phone]

## 2021-03-03 ENCOUNTER — Other Ambulatory Visit: Payer: Self-pay

## 2021-03-03 ENCOUNTER — Emergency Department (HOSPITAL_COMMUNITY): Payer: No Typology Code available for payment source

## 2021-03-03 ENCOUNTER — Emergency Department (HOSPITAL_COMMUNITY)
Admission: EM | Admit: 2021-03-03 | Discharge: 2021-03-03 | Disposition: A | Discharge status: Home / Self Care | Payer: No Typology Code available for payment source | Attending: Emergency Medicine | Admitting: Emergency Medicine

## 2021-03-03 DIAGNOSIS — Z7984 Long term (current) use of oral hypoglycemic drugs: Secondary | ICD-10-CM | POA: Diagnosis not present

## 2021-03-03 DIAGNOSIS — Z955 Presence of coronary angioplasty implant and graft: Secondary | ICD-10-CM | POA: Diagnosis not present

## 2021-03-03 DIAGNOSIS — I25119 Atherosclerotic heart disease of native coronary artery with unspecified angina pectoris: Secondary | ICD-10-CM | POA: Insufficient documentation

## 2021-03-03 DIAGNOSIS — W231XXA Caught, crushed, jammed, or pinched between stationary objects, initial encounter: Secondary | ICD-10-CM | POA: Insufficient documentation

## 2021-03-03 DIAGNOSIS — Y990 Civilian activity done for income or pay: Secondary | ICD-10-CM | POA: Diagnosis not present

## 2021-03-03 DIAGNOSIS — S61215A Laceration without foreign body of left ring finger without damage to nail, initial encounter: Secondary | ICD-10-CM | POA: Insufficient documentation

## 2021-03-03 DIAGNOSIS — Z7901 Long term (current) use of anticoagulants: Secondary | ICD-10-CM | POA: Insufficient documentation

## 2021-03-03 DIAGNOSIS — S6992XA Unspecified injury of left wrist, hand and finger(s), initial encounter: Secondary | ICD-10-CM | POA: Diagnosis present

## 2021-03-03 DIAGNOSIS — Z23 Encounter for immunization: Secondary | ICD-10-CM | POA: Insufficient documentation

## 2021-03-03 DIAGNOSIS — Z87891 Personal history of nicotine dependence: Secondary | ICD-10-CM | POA: Diagnosis not present

## 2021-03-03 DIAGNOSIS — E119 Type 2 diabetes mellitus without complications: Secondary | ICD-10-CM | POA: Insufficient documentation

## 2021-03-03 MED ORDER — CEPHALEXIN 500 MG PO CAPS: 500.0000 mg | ORAL_CAPSULE | Freq: Four times a day (QID) | ORAL | 0 refills | Status: AC

## 2021-03-03 MED ORDER — OXYCODONE-ACETAMINOPHEN 5-325 MG PO TABS
1.0000 | ORAL_TABLET | Freq: Once | ORAL | Status: AC
Start: 2021-03-03 — End: 2021-03-03
  Administered 2021-03-03: 1 via ORAL
  Filled 2021-03-03: qty 1

## 2021-03-03 MED ORDER — IBUPROFEN 400 MG PO TABS
400.0000 mg | ORAL_TABLET | Freq: Once | ORAL | Status: AC
  Administered 2021-03-03: 400 mg via ORAL
  Filled 2021-03-03: qty 1

## 2021-03-03 MED ORDER — OXYCODONE-ACETAMINOPHEN 5-325 MG PO TABS
1.0000 | ORAL_TABLET | Freq: Once | ORAL | Status: AC
  Administered 2021-03-03: 1 via ORAL
  Filled 2021-03-03: qty 1

## 2021-03-03 MED ORDER — CEPHALEXIN 250 MG PO CAPS
500.0000 mg | ORAL_CAPSULE | Freq: Once | ORAL | Status: AC
  Administered 2021-03-03: 500 mg via ORAL
  Filled 2021-03-03: qty 2

## 2021-03-03 MED ORDER — LIDOCAINE-EPINEPHRINE (PF) 2 %-1:200000 IJ SOLN
10.0000 mL | Freq: Once | INTRAMUSCULAR | Status: AC
  Administered 2021-03-03: 10 mL via INTRADERMAL
  Filled 2021-03-03: qty 20

## 2021-03-03 MED ORDER — TETANUS-DIPHTH-ACELL PERTUSSIS 5-2.5-18.5 LF-MCG/0.5 IM SUSY
0.5000 mL | PREFILLED_SYRINGE | Freq: Once | INTRAMUSCULAR | Status: AC
  Administered 2021-03-03: 0.5 mL via INTRAMUSCULAR
  Filled 2021-03-03: qty 0.5

## 2021-03-03 NOTE — ED Provider Notes (Signed)
[facility] EMERGENCY DEPARTMENT Provider Note   CSN: [id number] Arrival date & time: 03/03/21  1410     History Chief Complaint  Patient presents with   Finger Injury    Miguel Thornton is a 50 y.o. male.  HPI  50 year old right-handed male presenting to the emergency department with a left ring finger injury.  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  He reports sharp left finger pain, constant, nonradiating.  He has difficulty ranging the left digit due to swelling.  The Percocet he received in the waiting room significantly improved his pain, but his pain is returned.  He is not certain when his last tetanus shot was.  Past Medical History:  Diagnosis Date   Coronary artery disease    Myocardial infarction (HCC) 02/22/2016   "small"   Type II diabetes mellitus (HCC)    "diet controlled" (05/23/2016)    Patient Active Problem List   Diagnosis Date Noted   UTI (urinary tract infection) 11/11/2016   Controlled type 2 diabetes mellitus without complication, without long-term current use of insulin (HCC) 11/11/2016   Acute kidney injury (HCC) 11/11/2016   Sepsis (HCC) 11/11/2016   Post PTCA 05/23/2016   Atherosclerosis of native coronary artery of native heart with angina pectoris (HCC) 05/21/2016   Appendicitis 02/17/2014    Past Surgical History:  Procedure Laterality Date   APPENDECTOMY     CARDIAC CATHETERIZATION N/A 05/23/2016   Procedure: Left Heart Cath and Coronary Angiography;  Surgeon: Yates Decamp, MD;  Location: [facility] INVASIVE CV LAB;  Service: Cardiovascular;  Laterality: N/A;   CARDIAC CATHETERIZATION N/A 05/23/2016   Procedure: Coronary/Bypass Graft CTO Intervention;  Surgeon: Yates Decamp, MD;  Location: [facility] INVASIVE CV LAB;  Service: Cardiovascular;  Laterality: N/A;   CORONARY ANGIOPLASTY WITH STENT PLACEMENT  05/23/2016   CORONARY STENT  INTERVENTION N/A 12/19/2016   Procedure: Coronary Stent Intervention;  Surgeon: Yates Decamp, MD;  Location: [facility] INVASIVE CV LAB;  Service: Cardiovascular;  Laterality: N/A;   LAPAROSCOPIC APPENDECTOMY N/A 02/17/2014   Procedure: APPENDECTOMY LAPAROSCOPIC;  Surgeon: Axel Filler, MD;  Location: [facility] OR;  Service: General;  Laterality: N/A;   LEFT HEART CATH AND CORONARY ANGIOGRAPHY N/A 12/19/2016   Procedure: Left Heart Cath and Coronary Angiography;  Surgeon: Yates Decamp, MD;  Location: [facility] INVASIVE CV LAB;  Service: Cardiovascular;  Laterality: N/A;   SKIN GRAFT SPLIT THICKNESS LEG / FOOT Right ~ 1987   "foot"       Family History  Problem Relation Age of Onset   Heart attack Paternal Grandfather    Congestive Heart Failure Paternal Grandfather    Heart attack Maternal Grandfather    Congestive Heart Failure Maternal Grandfather    Diabetes Paternal Grandmother     Social History   Tobacco Use   Smoking status: Former    Packs/day: 1.00    Years: 18.00    Pack years: 18.00    Types: Cigarettes    Quit date: 09/15/2006    Years since quitting: 14.4   Smokeless tobacco: Never  Substance Use Topics   Alcohol use: Yes    Comment: 05/23/2016 "might have 1 drink/month; if that   Drug use: Yes    Types: Marijuana    Comment: 05/23/2016 "quit in the 1990s"    Home Medications Prior to Admission medications   Medication Sig Start Date End Date  Taking? Authorizing Provider  cephALEXin (KEFLEX) 500 MG capsule Take 1 capsule (500 mg total) by mouth 4 (four) times daily. 03/03/21  Yes Lenard Lance, MD  atorvastatin (LIPITOR) 20 MG tablet Take 20 mg by mouth daily.     [provider]  b complex vitamins tablet Take 1 tablet by mouth daily.    [provider]  carvedilol (COREG) 6.25 MG tablet Take 6.25 mg by mouth 2 (two) times daily.    [provider]  lisinopril (PRINIVIL,ZESTRIL) 10 MG tablet Take 10 mg by mouth daily after supper.    [provider]   metFORMIN (GLUCOPHAGE XR) 500 MG 24 hr tablet Take 2 tablets (1,000 mg total) by mouth 2 (two) times daily. 12/21/16   Yates Decamp, MD  Multiple Vitamin (MULTIVITAMIN WITH MINERALS) TABS tablet Take 1 tablet by mouth daily.    [provider]  nitroGLYCERIN (NITROSTAT) 0.4 MG SL tablet Place 0.4 mg under the tongue every 5 (five) minutes as needed for chest pain.    [provider]  rivaroxaban (XARELTO) 10 MG TABS tablet Take 1 tablet (10 mg total) by mouth daily. 12/19/16   Yates Decamp, MD  ticagrelor (BRILINTA) 90 MG TABS tablet Take 1 tablet (90 mg total) by mouth 2 (two) times daily. 05/24/16   Yates Decamp, MD    Allergies    Patient has no known allergies.  Review of Systems   Review of Systems  Constitutional:  Negative for chills and fever.  HENT:  Negative for ear pain and sore throat.   Eyes:  Negative for pain and visual disturbance.  Respiratory:  Negative for cough and shortness of breath.   Cardiovascular:  Negative for chest pain and palpitations.  Gastrointestinal:  Negative for abdominal pain and vomiting.  Genitourinary:  Negative for dysuria and hematuria.  Musculoskeletal:  Negative for arthralgias and back pain.  Skin:  Positive for wound. Negative for color change and rash.  Neurological:  Negative for seizures and syncope.  All other systems reviewed and are negative.  Physical Exam Updated Vital Signs BP (!) 143/90   Pulse 71   Temp 97.6 F (36.4 C) (Oral)   Resp 16   SpO2 100%   Physical Exam Vitals and nursing note reviewed.  Constitutional:      Appearance: He is well-developed.  HENT:     Head: Normocephalic and atraumatic.  Eyes:     Conjunctiva/sclera: Conjunctivae normal.  Cardiovascular:     Rate and Rhythm: Normal rate and regular rhythm.     Heart sounds: No murmur heard. Pulmonary:     Effort: Pulmonary effort is normal. No respiratory distress.     Breath sounds: Normal breath sounds.  Abdominal:     Palpations: Abdomen  is soft.     Tenderness: There is no abdominal tenderness.  Musculoskeletal:        General: Deformity and signs of injury present.     Cervical back: Neck supple.     Comments: Crush injury to the left fourth distal ring finger.  There is a laceration at the distal tip of the ring finger that travels posteriorly just under the nail itself but does not violate the nailbed.  With isolation, the patient is able to easily range the MCP, and PIP of the left fourth digit.  Difficulty flexing the DIP, partially due to swelling.  Skin:    General: Skin is warm and dry.     Capillary Refill: Capillary refill takes less than 2  seconds.  Neurological:     Mental Status: He is alert and oriented to person, place, and time.    ED Results / Procedures / Treatments   Labs (all labs ordered are listed, but only abnormal results are displayed) Labs Reviewed - No data to display  EKG None  Radiology DG Hand Complete Left  Result Date: 03/03/2021 CLINICAL DATA:  Crush injury fourth digit EXAM: LEFT HAND - COMPLETE 3+ VIEW COMPARISON:  None. FINDINGS: Acute comminuted and displaced fracture involving the tuft of the fourth distal phalanx with vertical fracture lucency extending to the base. No definite articular surface extension. No malalignment. No radiopaque foreign body IMPRESSION: Acute comminuted and displaced fracture involving the fourth distal phalanx Electronically Signed   By: Jasmine Pang M.D.   On: 03/03/2021 15:10    Procedures .Marland KitchenLaceration Repair  Date/Time: 03/04/2021 12:40 AM Performed by: Lenard Lance, MD Authorized by: Rolan Bucco, MD   Consent:    Consent obtained:  Verbal   Consent given by:  Patient   Risks discussed:  Infection, pain, poor cosmetic result, poor wound healing, vascular damage and need for additional repair   Alternatives discussed:  Delayed treatment and referral Universal protocol:    Procedure explained and questions answered to patient or proxy's  satisfaction: yes     Patient identity confirmed:  Verbally with patient Anesthesia:    Anesthesia method:  Nerve block   Block location:  Ring finger   Block needle gauge:  25 G   Block anesthetic:  Lidocaine 1% w/o epi   Block technique:  Digital   Block injection procedure:  Anatomic landmarks identified, introduced needle, incremental injection and negative aspiration for blood   Block outcome:  Anesthesia achieved Laceration details:    Location:  Finger   Finger location:  L ring finger   Length (cm):  3 Pre-procedure details:    Preparation:  Patient was prepped and draped in usual sterile fashion and imaging obtained to evaluate for foreign bodies Exploration:    Hemostasis achieved with:  Epinephrine and direct pressure   Imaging obtained: x-ray     Imaging outcome: foreign body not noted     Wound exploration: wound explored through full range of motion     Wound extent: underlying fracture     Wound extent: no tendon damage noted     Contaminated: no   Treatment:    Area cleansed with:  Povidone-iodine   Amount of cleaning:  Extensive   Irrigation solution:  Sterile water   Irrigation volume:  500cc   Irrigation method:  Syringe   Debridement:  None   Undermining:  None   Scar revision: no   Skin repair:    Repair method:  Sutures   Suture size:  4-0   Wound skin closure material used: Ethilon.   Suture technique: 1 horizontal mattress due to the tension across the crush injury, simple interrupted x2.   Number of sutures:  3 Approximation:    Approximation:  Close Repair type:    Repair type:  Intermediate Post-procedure details:    Dressing:  Sterile dressing   Procedure completion:  Tolerated well, no immediate complications   Medications Ordered in ED Medications  oxyCODONE-acetaminophen (PERCOCET/ROXICET) 5-325 MG per tablet 1 tablet (1 tablet Oral Given 03/03/21 1609)  oxyCODONE-acetaminophen (PERCOCET/ROXICET) 5-325 MG per tablet 1 tablet (1 tablet  Oral Given 03/03/21 2054)  ibuprofen (ADVIL) tablet 400 mg (400 mg Oral Given 03/03/21 2054)  lidocaine-EPINEPHrine (XYLOCAINE W/EPI) 2 %-1:200000 (PF)  injection 10 mL (10 mLs Intradermal Given by Other 03/03/21 2225)  Tdap (BOOSTRIX) injection 0.5 mL (0.5 mLs Intramuscular Given 03/03/21 2055)  cephALEXin (KEFLEX) capsule 500 mg (500 mg Oral Given 03/03/21 2055)  oxyCODONE-acetaminophen (PERCOCET/ROXICET) 5-325 MG per tablet 1 tablet (1 tablet Oral Given 03/03/21 2258)    ED Course  I have reviewed the triage vital signs and the nursing notes.  Pertinent labs & imaging results that were available during my care of the patient were reviewed by me and considered in my medical decision making (see chart for details).    MDM Rules/Calculators/A&P                           50 year old right-handed male presenting after crush injury to his left ring finger.  Vital signs reviewed, within acceptable limits.  Physical exam is notable for Crush injury to left ring finger, the laceration does not appear to involve the nailbed.  Patient is able to range the MCP, PIP, and DIP of that finger when isolated but the range of the DIP is slightly limited due to pain and swelling.  There does not appear to be true flexor tendon involvement in the laceration. Will obtain plain film to evaluate for underlying fracture.  There is a distal tuft fracture associated with a crush injury.  Will provide antibiotics here in the emergency department.  The fingertip was under tension due to the crush nature of the injury, required suture repair as detailed above.  Analgesics provided.  After repair, I believe that he is stable for discharge in the emergency department and outpatient hand surgery follow-up.  Given clinic information, antibiotics.  Patient is comfortable with this plan, strict return precautions discussed.  Final Clinical Impression(s) / ED Diagnoses Final diagnoses:  Laceration of left ring finger without foreign  body, nail damage status unspecified, initial encounter    Rx / DC Orders ED Discharge Orders          Ordered    cephALEXin (KEFLEX) 500 MG capsule  4 times daily        03/03/21 2227             Lenard Lance, MD 03/04/21 2836    Rolan Bucco, MD 03/04/21 [phone]

## 2021-03-03 NOTE — ED Provider Notes (Signed)
Emergency Medicine Provider Triage Evaluation Note  Miguel Thornton , a 50 y.o. male  was evaluated in triage.  Pt complains of finger injury.  Injury occurred approximately 1 hour prior.  Patient states that steel roller crush his left ring finger while at work.  Reports laceration and numbness to affected digit.  Patient is right-hand dominant.  Patient reports last tetanus shot within 5 years.  Review of Systems  Positive: Wound, numbness, decreased range of motion Negative:   Physical Exam  BP 129/83   Pulse 63   Temp 97.9 F (36.6 C)   Resp 14   SpO2 100%  Gen:   Awake, no distress   Resp:  Normal effort  MSK:   Moves extremities without difficulty, patient has laceration to dorsum of left finger from DIP to distal tip of finger.  Patient has decreased range of motion at DIP, decreased sensation throughout affected digit.  No other injuries to left hand.  Left DP pulse intact. Other:    Medical Decision Making  Medically screening exam initiated at 2:25 PM.  Appropriate orders placed.  DEIGO ALONSO was informed that the remainder of the evaluation will be completed by another provider, this initial triage assessment does not replace that evaluation, and the importance of remaining in the ED until their evaluation is complete.  The patient appears stable so that the remainder of the work up may be completed by another provider.      Haskel Schroeder, PA-C 03/03/21 1440    Wynetta Fines, MD 03/05/21 [phone]

## 2021-03-03 NOTE — ED Triage Notes (Signed)
Pt from work after smashing L ring finger with steel roller at work less than one hour ago. Numbness and tingling to same, bleeding controlled. Last tetanus less than five years ago.

## 2021-03-03 NOTE — Discharge Instructions (Addendum)
Lease take your antibiotics as prescribed, please call the hand surgeon's phone number that I have included to follow-up with them in clinic.  These sutures need to be taken out in 10 to 14 days.

## 2021-03-11 ENCOUNTER — Emergency Department
Admission: EM | Admit: 2021-03-11 | Discharge: 2021-03-11 | Disposition: A | Discharge status: Home / Self Care | Payer: No Typology Code available for payment source | Attending: Emergency Medicine | Admitting: Emergency Medicine

## 2021-03-11 ENCOUNTER — Other Ambulatory Visit: Payer: Self-pay

## 2021-03-11 ENCOUNTER — Emergency Department: Payer: No Typology Code available for payment source

## 2021-03-11 DIAGNOSIS — Z7984 Long term (current) use of oral hypoglycemic drugs: Secondary | ICD-10-CM | POA: Insufficient documentation

## 2021-03-11 DIAGNOSIS — S62601S Fracture of unspecified phalanx of left index finger, sequela: Secondary | ICD-10-CM | POA: Insufficient documentation

## 2021-03-11 DIAGNOSIS — Z87891 Personal history of nicotine dependence: Secondary | ICD-10-CM | POA: Insufficient documentation

## 2021-03-11 DIAGNOSIS — Z79899 Other long term (current) drug therapy: Secondary | ICD-10-CM | POA: Insufficient documentation

## 2021-03-11 DIAGNOSIS — Z955 Presence of coronary angioplasty implant and graft: Secondary | ICD-10-CM | POA: Diagnosis not present

## 2021-03-11 DIAGNOSIS — Z4802 Encounter for removal of sutures: Secondary | ICD-10-CM | POA: Diagnosis not present

## 2021-03-11 DIAGNOSIS — Z7901 Long term (current) use of anticoagulants: Secondary | ICD-10-CM | POA: Insufficient documentation

## 2021-03-11 DIAGNOSIS — I25119 Atherosclerotic heart disease of native coronary artery with unspecified angina pectoris: Secondary | ICD-10-CM | POA: Diagnosis not present

## 2021-03-11 DIAGNOSIS — E119 Type 2 diabetes mellitus without complications: Secondary | ICD-10-CM | POA: Diagnosis not present

## 2021-03-11 DIAGNOSIS — S61211D Laceration without foreign body of left index finger without damage to nail, subsequent encounter: Secondary | ICD-10-CM

## 2021-03-11 DIAGNOSIS — W230XXS Caught, crushed, jammed, or pinched between moving objects, sequela: Secondary | ICD-10-CM | POA: Insufficient documentation

## 2021-03-11 LAB — CBC WITH DIFFERENTIAL/PLATELET
Abs Immature Granulocytes: 0.01 10*3/uL (ref 0.00–0.07)
Basophils Absolute: 0.1 10*3/uL (ref 0.0–0.1)
Basophils Relative: 2 %
Eosinophils Absolute: 0.5 10*3/uL (ref 0.0–0.5)
Eosinophils Relative: 9 %
HCT: 40.2 % (ref 39.0–52.0)
Hemoglobin: 14 g/dL (ref 13.0–17.0)
Immature Granulocytes: 0 %
Lymphocytes Relative: 30 %
Lymphs Abs: 1.7 10*3/uL (ref 0.7–4.0)
MCH: 31.9 pg (ref 26.0–34.0)
MCHC: 34.8 g/dL (ref 30.0–36.0)
MCV: 91.6 fL (ref 80.0–100.0)
Monocytes Absolute: 0.4 10*3/uL (ref 0.1–1.0)
Monocytes Relative: 7 %
Neutro Abs: 3.1 10*3/uL (ref 1.7–7.7)
Neutrophils Relative %: 52 %
Platelets: 189 10*3/uL (ref 150–400)
RBC: 4.39 MIL/uL (ref 4.22–5.81)
RDW: 13.4 % (ref 11.5–15.5)
WBC: 5.8 10*3/uL (ref 4.0–10.5)
nRBC: 0 % (ref 0.0–0.2)

## 2021-03-11 LAB — COMPREHENSIVE METABOLIC PANEL
ALT: 21 U/L (ref 0–44)
AST: 20 U/L (ref 15–41)
Albumin: 4 g/dL (ref 3.5–5.0)
Alkaline Phosphatase: 51 U/L (ref 38–126)
Anion gap: 7 (ref 5–15)
BUN: 27 mg/dL — ABNORMAL HIGH (ref 6–20)
CO2: 22 mmol/L (ref 22–32)
Calcium: 9 mg/dL (ref 8.9–10.3)
Chloride: 104 mmol/L (ref 98–111)
Creatinine, Ser: 0.95 mg/dL (ref 0.61–1.24)
GFR, Estimated: >60 mL/min (ref >60)
Glucose, Bld: 164 mg/dL — ABNORMAL HIGH (ref 70–99)
Potassium: 4.1 mmol/L (ref 3.5–5.1)
Sodium: 133 mmol/L — ABNORMAL LOW (ref 135–145)
Total Bilirubin: 0.7 mg/dL (ref 0.3–1.2)
Total Protein: 7.1 g/dL (ref 6.5–8.1)

## 2021-03-11 MED ORDER — HYDROCODONE-ACETAMINOPHEN 5-325 MG PO TABS: 1.0000 | ORAL_TABLET | ORAL | 0 refills | Status: AC | PRN

## 2021-03-11 MED ORDER — MORPHINE SULFATE (PF) 4 MG/ML IV SOLN
4.0000 mg | Freq: Once | INTRAVENOUS | Status: AC
  Administered 2021-03-11: 4 mg via INTRAMUSCULAR
  Filled 2021-03-11: qty 1

## 2021-03-11 MED ORDER — CLINDAMYCIN HCL 300 MG PO CAPS: 300.0000 mg | ORAL_CAPSULE | Freq: Three times a day (TID) | ORAL | 0 refills | Status: AC

## 2021-03-11 MED ORDER — LIDOCAINE HCL (PF) 1 % IJ SOLN
5.0000 mL | Freq: Once | INTRAMUSCULAR | Status: AC
  Administered 2021-03-11: 5 mL
  Filled 2021-03-11: qty 5

## 2021-03-11 NOTE — Discharge Instructions (Addendum)
You have been seen in the emergency department today for left finger laceration and pain.  Sutures have been removed.  This has been wrapped with a special antibiotic infused ointment/gauze.  Please leave the dressing in place for the next 48 hours and then remove and change daily.  Please follow-up with orthopedics if you continue to have pain or swelling in the finger.  A small amount of blood/drainage is expected.  Return for any signs of infection such as increased pain redness, pus or fever.

## 2021-03-11 NOTE — ED Triage Notes (Signed)
First Nurse Note:  Arrives for ED evaluation of left hand.  Injured hand at work 8/18, crush injury with a steel roller.  Patient states pain persists and was referred to ED for further imaging.

## 2021-03-11 NOTE — ED Provider Notes (Signed)
[facility] Emergency Department Provider Note  Time seen: 8:49 PM  I have reviewed the triage vital signs and the nursing notes.   HISTORY  Chief Complaint Wound Infection   HPI Miguel Thornton is a 50 y.o. male with a past medical history of CAD, diabetes, prior MI, presents to the emergency department for left index finger pain and swelling.  According to the patient approximately 1 week ago his finger was involved in a crush injury.  Patient had a finger fracture as well as laceration that was repaired with sutures.  He went back to the doctor to have his sutures out today but due to the patient's discomfort and swelling that the doctor noted he asked him to go to the emergency department for further evaluation.  Patient denies any fever.  Patient was prescribed antibiotics which she is still taking.   Past Medical History:  Diagnosis Date   Coronary artery disease    Myocardial infarction (HCC) 02/22/2016   "small"   Type II diabetes mellitus (HCC)    "diet controlled" (05/23/2016)    Patient Active Problem List   Diagnosis Date Noted   UTI (urinary tract infection) 11/11/2016   Controlled type 2 diabetes mellitus without complication, without long-term current use of insulin (HCC) 11/11/2016   Acute kidney injury (HCC) 11/11/2016   Sepsis (HCC) 11/11/2016   Post PTCA 05/23/2016   Atherosclerosis of native coronary artery of native heart with angina pectoris (HCC) 05/21/2016   Appendicitis 02/17/2014    Past Surgical History:  Procedure Laterality Date   APPENDECTOMY     CARDIAC CATHETERIZATION N/A 05/23/2016   Procedure: Left Heart Cath and Coronary Angiography;  Surgeon: Yates Decamp, MD;  Location: [facility] INVASIVE CV LAB;  Service: Cardiovascular;  Laterality: N/A;   CARDIAC CATHETERIZATION N/A 05/23/2016   Procedure: Coronary/Bypass Graft CTO Intervention;  Surgeon: Yates Decamp, MD;  Location: [facility] INVASIVE CV LAB;  Service: Cardiovascular;  Laterality: N/A;    CORONARY ANGIOPLASTY WITH STENT PLACEMENT  05/23/2016   CORONARY STENT INTERVENTION N/A 12/19/2016   Procedure: Coronary Stent Intervention;  Surgeon: Yates Decamp, MD;  Location: [facility] INVASIVE CV LAB;  Service: Cardiovascular;  Laterality: N/A;   LAPAROSCOPIC APPENDECTOMY N/A 02/17/2014   Procedure: APPENDECTOMY LAPAROSCOPIC;  Surgeon: Axel Filler, MD;  Location: [facility] OR;  Service: General;  Laterality: N/A;   LEFT HEART CATH AND CORONARY ANGIOGRAPHY N/A 12/19/2016   Procedure: Left Heart Cath and Coronary Angiography;  Surgeon: Yates Decamp, MD;  Location: [facility] INVASIVE CV LAB;  Service: Cardiovascular;  Laterality: N/A;   SKIN GRAFT SPLIT THICKNESS LEG / FOOT Right ~ 1987   "foot"    Prior to Admission medications   Medication Sig Start Date End Date Taking? Authorizing Provider  atorvastatin (LIPITOR) 20 MG tablet Take 20 mg by mouth daily.     [provider]  b complex vitamins tablet Take 1 tablet by mouth daily.    [provider]  carvedilol (COREG) 6.25 MG tablet Take 6.25 mg by mouth 2 (two) times daily.    [provider]  cephALEXin (KEFLEX) 500 MG capsule Take 1 capsule (500 mg total) by mouth 4 (four) times daily. 03/03/21   Lenard Lance, MD  lisinopril (PRINIVIL,ZESTRIL) 10 MG tablet Take 10 mg by mouth daily after supper.    [provider]  metFORMIN (GLUCOPHAGE XR) 500 MG 24 hr tablet Take 2 tablets (1,000 mg total) by mouth 2 (two) times daily. 12/21/16   Yates Decamp, MD  Multiple  Vitamin (MULTIVITAMIN WITH MINERALS) TABS tablet Take 1 tablet by mouth daily.    [provider]  nitroGLYCERIN (NITROSTAT) 0.4 MG SL tablet Place 0.4 mg under the tongue every 5 (five) minutes as needed for chest pain.    [provider]  rivaroxaban (XARELTO) 10 MG TABS tablet Take 1 tablet (10 mg total) by mouth daily. 12/19/16   Yates Decamp, MD  ticagrelor (BRILINTA) 90 MG TABS tablet Take 1 tablet (90 mg total) by mouth 2 (two) times daily. 05/24/16    Yates Decamp, MD    No Known Allergies  Family History  Problem Relation Age of Onset   Heart attack Paternal Grandfather    Congestive Heart Failure Paternal Grandfather    Heart attack Maternal Grandfather    Congestive Heart Failure Maternal Grandfather    Diabetes Paternal Grandmother     Social History Social History   Tobacco Use   Smoking status: Former    Packs/day: 1.00    Years: 18.00    Pack years: 18.00    Types: Cigarettes    Quit date: 09/15/2006    Years since quitting: 14.4   Smokeless tobacco: Never  Substance Use Topics   Alcohol use: Yes    Comment: 05/23/2016 "might have 1 drink/month; if that   Drug use: Yes    Types: Marijuana    Comment: 05/23/2016 "quit in the 1990s"    Review of Systems Constitutional: Negative for fever. Cardiovascular: Negative for chest pain. Respiratory: Negative for shortness of breath. Gastrointestinal: Negative for abdominal pain Musculoskeletal: Left index finger pain and swelling. Skin: Redness to the distal left index finger Neurological: Negative for headache All other ROS negative  ____________________________________________   PHYSICAL EXAM:  VITAL SIGNS: ED Triage Vitals  Enc Vitals Group     BP 03/11/21 1845 140/87     Pulse Rate 03/11/21 1845 78     Resp 03/11/21 1845 16     Temp 03/11/21 1845 98.3 F (36.8 C)     Temp Source 03/11/21 1845 Oral     SpO2 03/11/21 1845 97 %     Weight 03/11/21 1848 159 lb (72.1 kg)     Height 03/11/21 1848 5\' 7"  (1.702 m)     Head Circumference --      Peak Flow --      Pain Score 03/11/21 1848 8     Pain Loc --      Pain Edu? --      Excl. in GC? --    Constitutional: Alert and oriented. Well appearing and in no distress. Eyes: Normal exam ENT      Head: Normocephalic and atraumatic.      Mouth/Throat: Mucous membranes are moist. Cardiovascular: Normal rate, regular rhythm. Respiratory: Normal respiratory effort without tachypnea nor retractions. Breath sounds  are clear Gastrointestinal: Soft and nontender. No distention.   Musculoskeletal: Nontender with normal range of motion in all extremities.  Patient does have moderate tenderness to the left distal phalanx of the index finger with mild edema noted as well.  Laceration to the palmar aspect of the index finger with well-appearing incision and sutures in place.  Moderate erythema to the area however. Neurologic:  Normal speech and language. No gross focal neurologic deficits  Skin:  Skin is warm, dry.  Mild erythema and swelling noted to left distal index finger. Psychiatric: Mood and affect are normal.   ____________________________________________   RADIOLOGY  X-ray shows continued distal phalanx fracture.  With swelling but no gas.  ____________________________________________   INITIAL IMPRESSION / ASSESSMENT AND PLAN / ED COURSE  Pertinent labs & imaging results that were available during my care of the patient were reviewed by me and considered in my medical decision making (see chart for details).   Patient presents emergency department for continued pain and swelling to his left index finger where the patient suffered a crush injury approximately 1 week ago.  Patient laceration repaired with sutures at that time.  We will obtain an x-ray, lab work is largely nonrevealing.  X-ray shows continued fracture but no gas.  We will perform a digital block remove the sutures and evaluate for any abscess or fluid collection.  We will have the patient follow-up with orthopedics for further evaluation.  Patient agreeable to plan of care.  I performed a digital block and remove the patient's sutures.  Was able to more adequately evaluate the wound.  No signs of any abscess or pus.  We will wrap with Xeroform, discharged with antibiotics to prolong the patient's course of antibiotics as well as a short course of pain medication.  I discussed with the patient to follow-up with orthopedics if he  continues to have pain.  We will write referral for orthopedics as well.  Patient agreeable to plan of care  Miguel Thornton was evaluated in Emergency Department on 03/11/2021 for the symptoms described in the history of present illness. He was evaluated in the context of the global COVID-19 pandemic, which necessitated consideration that the patient might be at risk for infection with the SARS-CoV-2 virus that causes COVID-19. Institutional protocols and algorithms that pertain to the evaluation of patients at risk for COVID-19 are in a state of rapid change based on information released by regulatory bodies including the CDC and federal and state organizations. These policies and algorithms were followed during the patient's care in the ED.  ____________________________________________   FINAL CLINICAL IMPRESSION(S) / ED DIAGNOSES  Left index finger fracture, sequela, Suture removal   Minna Antis, MD 03/11/21 2116

## 2021-03-11 NOTE — ED Triage Notes (Signed)
See first nurse note- pt to ER with complaints of increased pain to left ring finger. Reports had a crush injury that required sutures, sutures still present. Finger extremely swollen, reports some drainage into bandages. Reports pain is so bad he can't bend his finger. States he wasn't provided with wound care teaching and was unsure how to take care of the site. Reports wearing dressing every day but hasn't been able to tolerate cleaning it.  Pt saw health at work and was told to seek further medical treatment at the ER.

## 2021-03-17 ENCOUNTER — Institutional Professional Consult (permissible substitution): Payer: BLUE CROSS/BLUE SHIELD | Admitting: Plastic Surgery
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
## 2023-07-25 ENCOUNTER — Encounter: Payer: Self-pay | Admitting: Emergency Medicine

## 2023-07-25 ENCOUNTER — Emergency Department: Payer: BLUE CROSS/BLUE SHIELD

## 2023-07-25 ENCOUNTER — Emergency Department
Admission: EM | Admit: 2023-07-25 | Discharge: 2023-07-25 | Disposition: A | Discharge status: Home / Self Care | Payer: Worker's Compensation | Attending: Emergency Medicine | Admitting: Emergency Medicine

## 2023-07-25 DIAGNOSIS — Y990 Civilian activity done for income or pay: Secondary | ICD-10-CM | POA: Diagnosis not present

## 2023-07-25 DIAGNOSIS — I2510 Atherosclerotic heart disease of native coronary artery without angina pectoris: Secondary | ICD-10-CM | POA: Diagnosis not present

## 2023-07-25 DIAGNOSIS — M546 Pain in thoracic spine: Secondary | ICD-10-CM | POA: Insufficient documentation

## 2023-07-25 DIAGNOSIS — M542 Cervicalgia: Secondary | ICD-10-CM | POA: Diagnosis not present

## 2023-07-25 DIAGNOSIS — M549 Dorsalgia, unspecified: Secondary | ICD-10-CM

## 2023-07-25 DIAGNOSIS — M545 Low back pain, unspecified: Secondary | ICD-10-CM | POA: Diagnosis not present

## 2023-07-25 DIAGNOSIS — M79606 Pain in leg, unspecified: Secondary | ICD-10-CM | POA: Insufficient documentation

## 2023-07-25 DIAGNOSIS — E119 Type 2 diabetes mellitus without complications: Secondary | ICD-10-CM | POA: Insufficient documentation

## 2023-07-25 MED ORDER — LIDOCAINE 5 % EX PTCH
1.0000 | MEDICATED_PATCH | CUTANEOUS | Status: DC
  Administered 2023-07-25: 1 via TRANSDERMAL
  Filled 2023-07-25: qty 1

## 2023-07-25 MED ORDER — KETOROLAC TROMETHAMINE 30 MG/ML IJ SOLN
30.0000 mg | Freq: Once | INTRAMUSCULAR | Status: AC
  Administered 2023-07-25: 30 mg via INTRAMUSCULAR
  Filled 2023-07-25: qty 1

## 2023-07-25 MED ORDER — LIDOCAINE 5 % EX PTCH: 1.0000 | MEDICATED_PATCH | Freq: Two times a day (BID) | CUTANEOUS | 0 refills | Status: AC

## 2023-07-25 MED ORDER — CYCLOBENZAPRINE HCL 5 MG PO TABS: 5.0000 mg | ORAL_TABLET | Freq: Three times a day (TID) | ORAL | 0 refills | Status: AC | PRN

## 2023-07-25 MED ORDER — OXYCODONE-ACETAMINOPHEN 5-325 MG PO TABS
1.0000 | ORAL_TABLET | Freq: Once | ORAL | Status: AC
  Administered 2023-07-25: 1 via ORAL
  Filled 2023-07-25: qty 1
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
## 2023-07-25 NOTE — ED Notes (Signed)
 Patient reports he doesn't need a drug screen for worker's comp.
